# Patient Record
Sex: Male | Born: 1955 | Race: Black or African American | Hispanic: Yes | Marital: Single
Health system: Midwestern US, Community
[De-identification: ages and names within clinical notes are randomized; demographics above are authoritative.]

## PROBLEM LIST (undated history)

## (undated) DIAGNOSIS — R55 Syncope and collapse: Principal | ICD-10-CM

## (undated) DIAGNOSIS — R7989 Other specified abnormal findings of blood chemistry: Principal | ICD-10-CM

## (undated) DIAGNOSIS — I10 Essential (primary) hypertension: Secondary | ICD-10-CM

## (undated) DIAGNOSIS — E0590 Thyrotoxicosis, unspecified without thyrotoxic crisis or storm: Secondary | ICD-10-CM

## (undated) DIAGNOSIS — R739 Hyperglycemia, unspecified: Secondary | ICD-10-CM

## (undated) DIAGNOSIS — R9431 Abnormal electrocardiogram [ECG] [EKG]: Secondary | ICD-10-CM

## (undated) DIAGNOSIS — N179 Acute kidney failure, unspecified: Secondary | ICD-10-CM

## (undated) DIAGNOSIS — Z1211 Encounter for screening for malignant neoplasm of colon: Secondary | ICD-10-CM

---

## 2010-12-20 IMAGING — CT CT CERVICAL SPINE WITHOUT CONTRAST
1 series · 12 of 14 positions shown, 15 images · non-contrast
Comparison: none

REASON FOR EXAM: injury ? fx at c4 on xray, neck jpain
COMMENTS:   LMP: male

[Series 5: axial · axial · 0.34mm/px · z∈[+160,+288]mm · 12 of 76 slices shown, 15 images]
[im 6/76  soft-tissue]
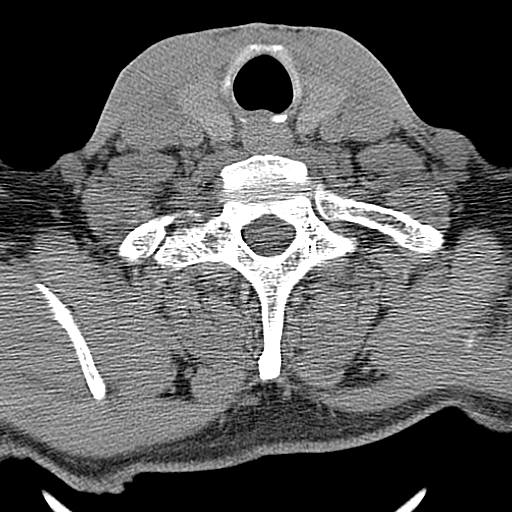
[im 6/76  bone]
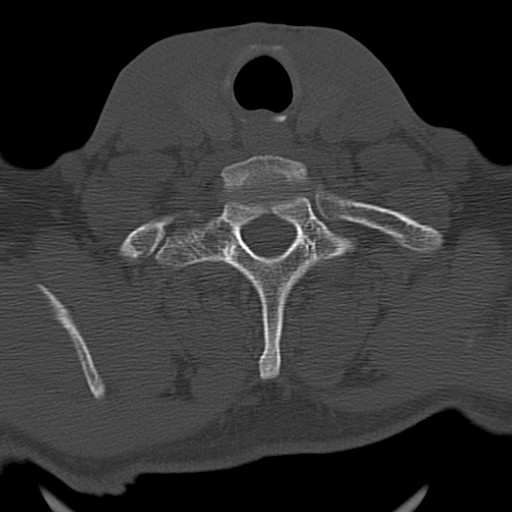
[im 12/76  bone]
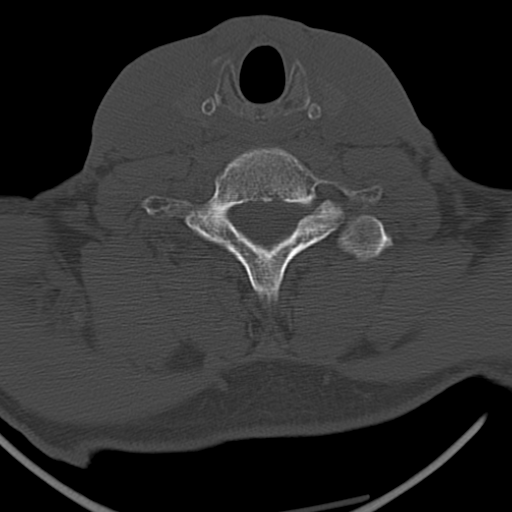
[im 18/76  bone]
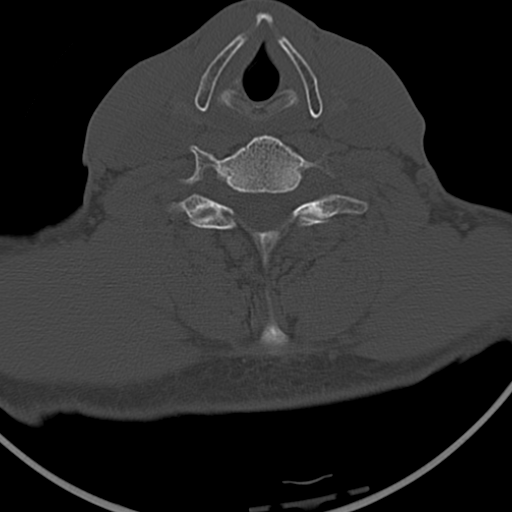
[im 24/76  bone]
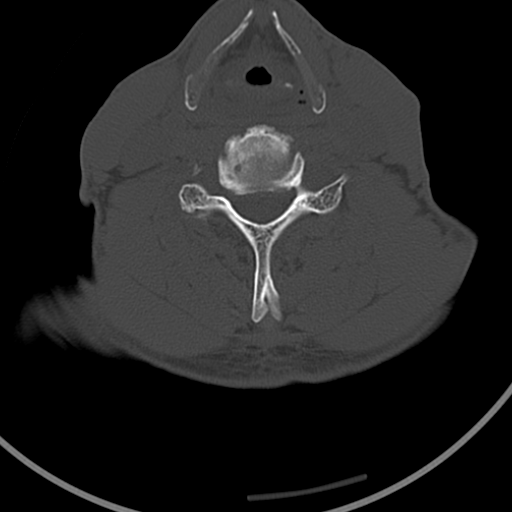
[im 29/76  soft-tissue]
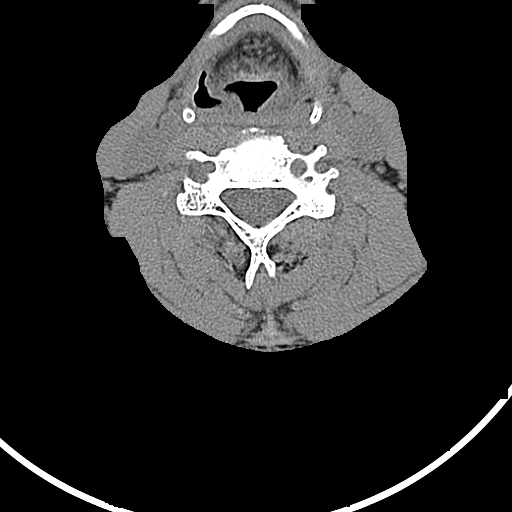
[im 29/76  bone]
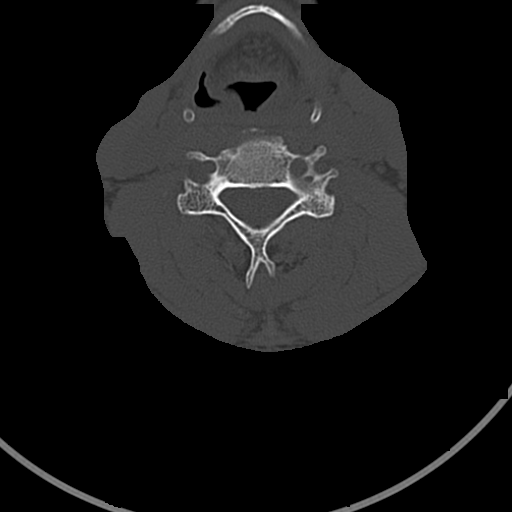
[im 35/76  bone]
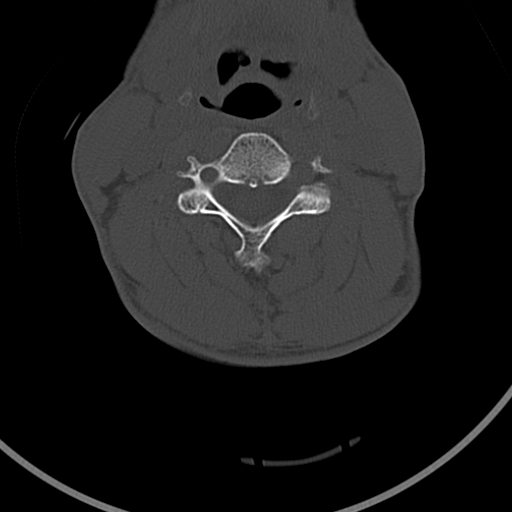
[im 41/76  bone]
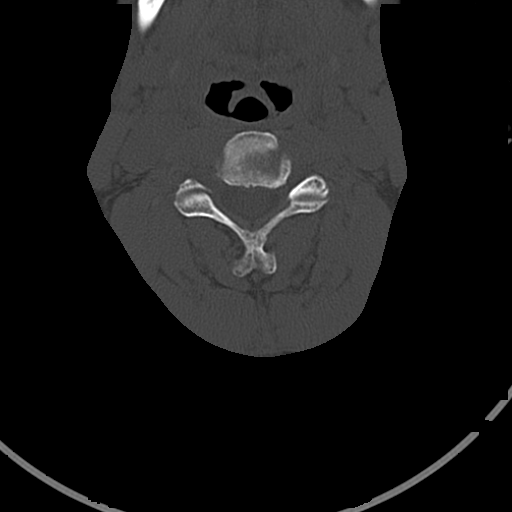
[im 47/76  bone]
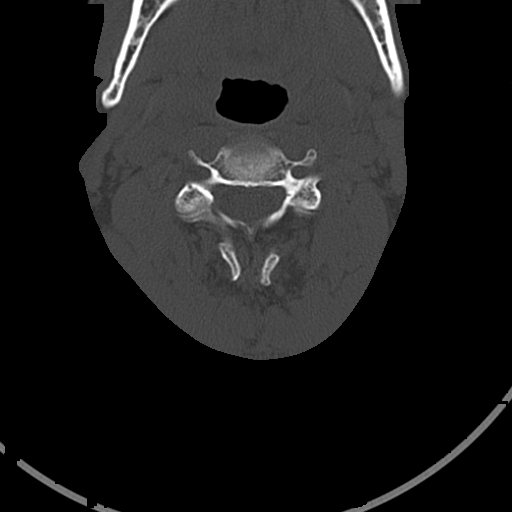
[im 52/76  soft-tissue]
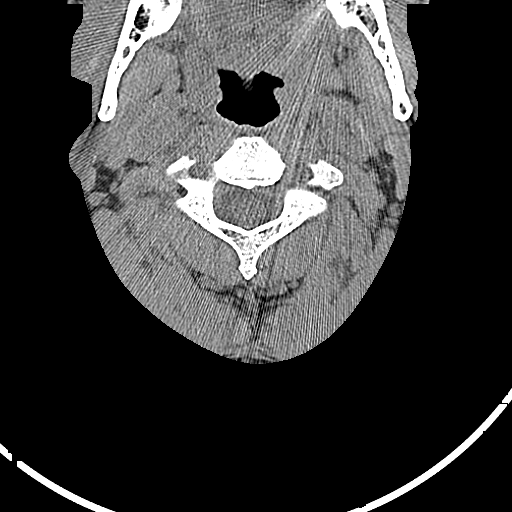
[im 52/76  bone]
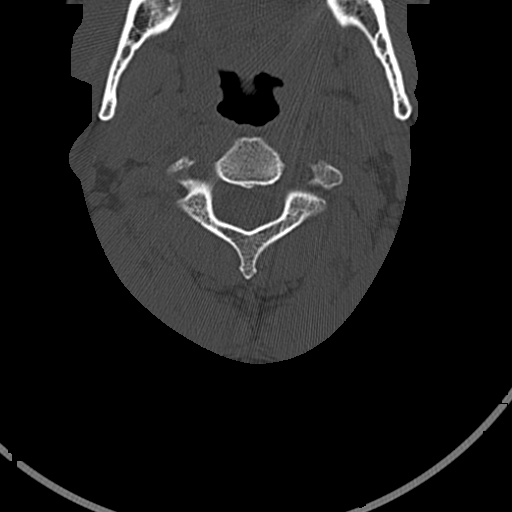
[im 58/76  bone]
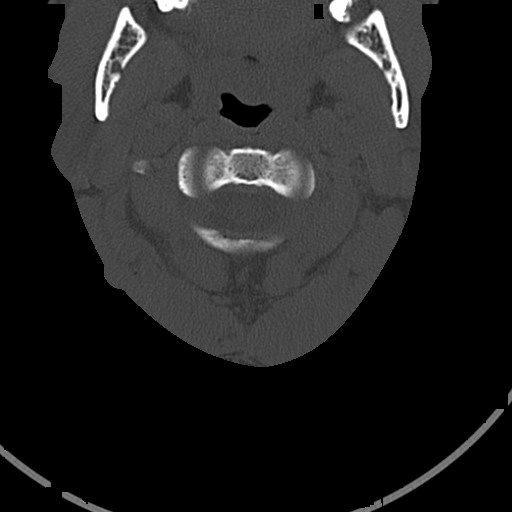
[im 64/76  bone]
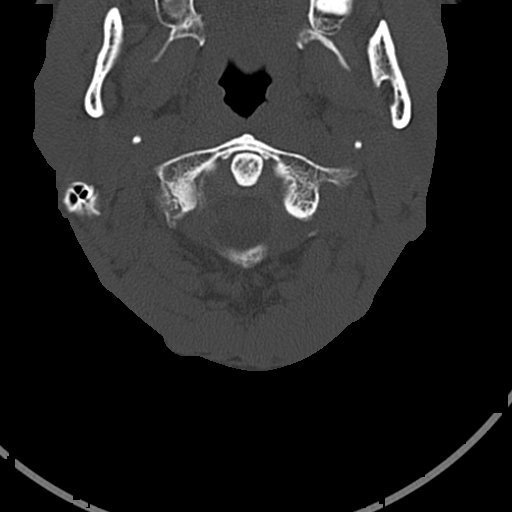
[im 70/76  bone]
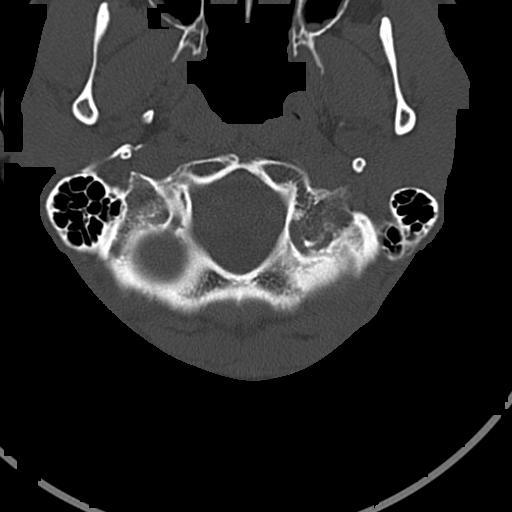

[12 of 14 positions shown; findings below may reference images not displayed]

PROCEDURE:     CT  - CT CERVICAL SPINE WO  - September 05, 2009  [DATE]

RESULT:     Axial, sagittal, and coronal images through the cervical spine
are reviewed.

 The cervical vertebral bodies are preserved in height. The prevertebral
soft tissue spaces are normal. There are anterior bridging osteophytes at
C4-C5 and C5-C6. The odontoid is intact. The bony ring at each cervical
level is intact. There is no evidence of a jumped facet. The lateral masses
of C1 align normally with those of C2. Incidental note is made of a small
amount of mucoperiosteal thickening in the visualized portions of the mid
maxillary sinuses.
IMPRESSION: 1. I do not see evidence of acute fracture nor dislocation of the cervical
spine.
2. There is moderate degenerative change at C5-C6 an mild degenerative
change at C4-C5 manifested by endplate osteophytes as well is annular disc
bulges. I do not see high-grade spinal stenosis.

A preliminary report was sent to the [HOSPITAL] the conclusion
of the study.

## 2012-08-08 IMAGING — CT CT HEAD WITHOUT CONTRAST
2 series · 16 of 30 positions shown, 20 images · non-contrast
Comparison: none

REASON FOR EXAM: HA
COMMENTS:   May transport without cardiac monitor

[Series 2: without · axial · non-contrast · 0.38mm/px · z∈[-56,+78]mm · 13 of 33 slices shown, 17 images]
[im 3/33  brain]
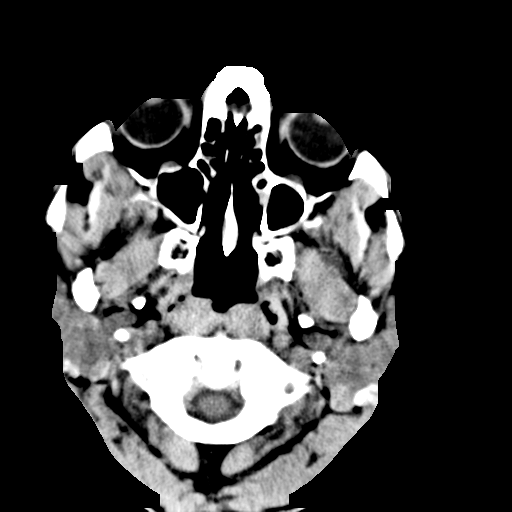
[im 3/33  bone]
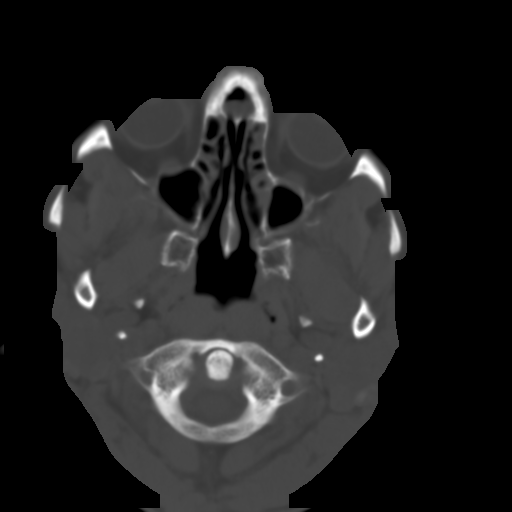
[im 5/33  brain]
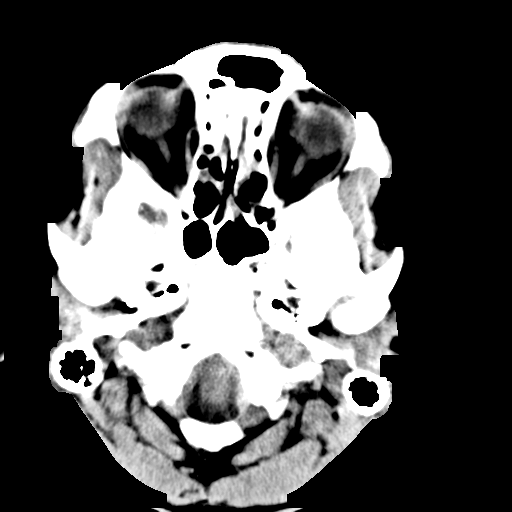
[im 7/33  brain]
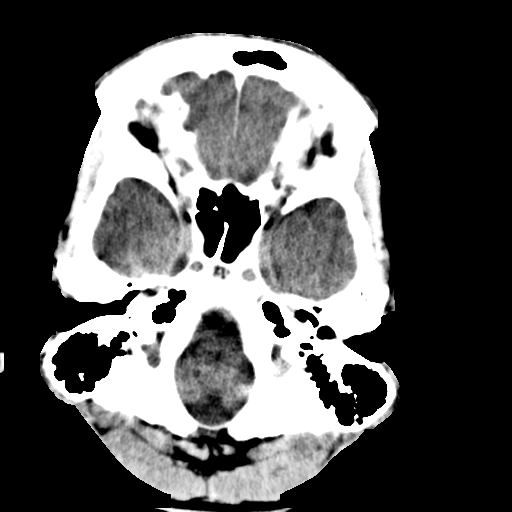
[im 10/33  brain]
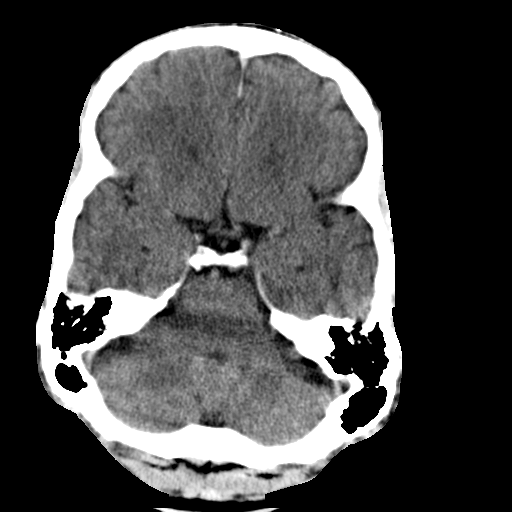
[im 12/33  brain]
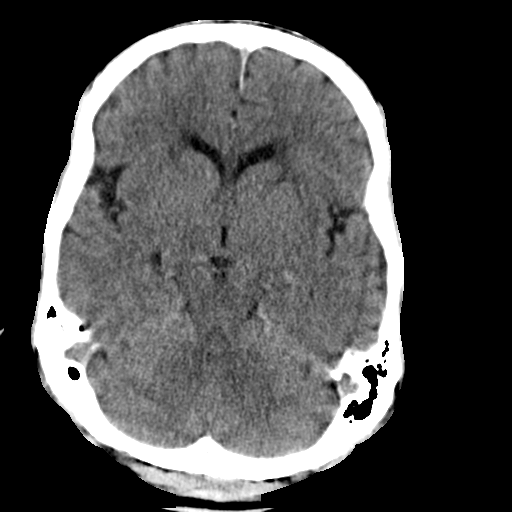
[im 12/33  bone]
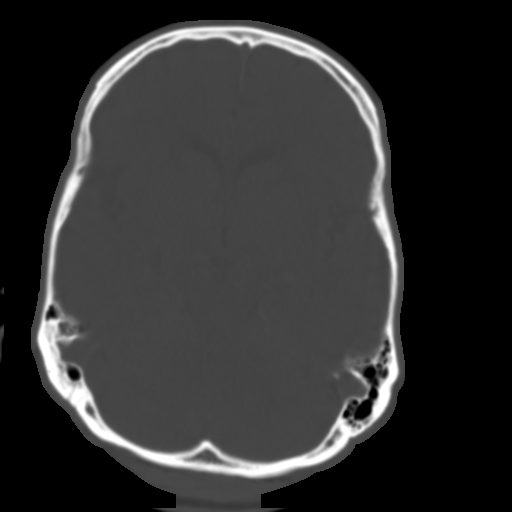
[im 14/33  brain]
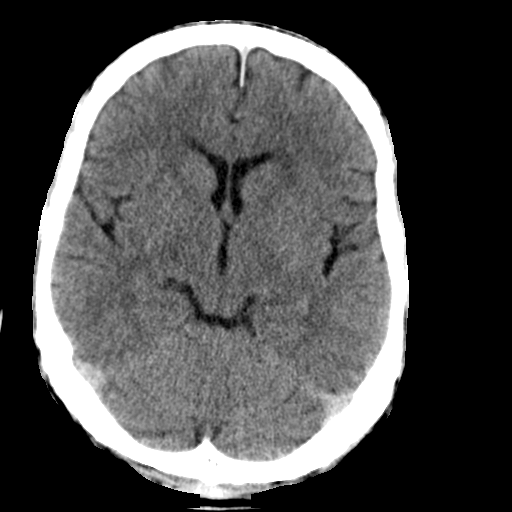
[im 17/33  brain]
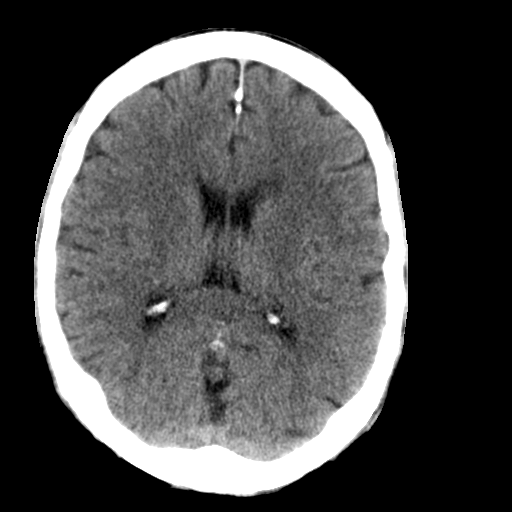
[im 19/33  brain]
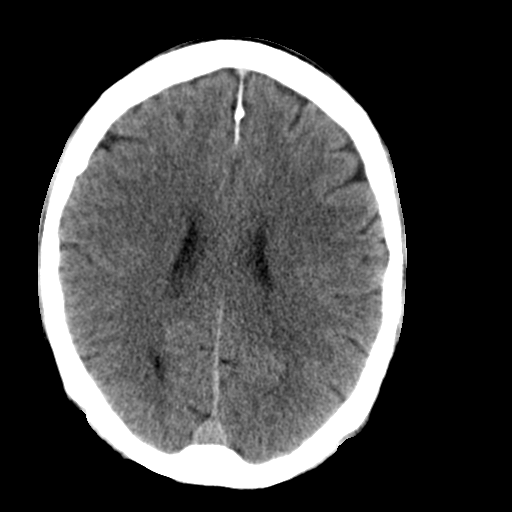
[im 21/33  brain]
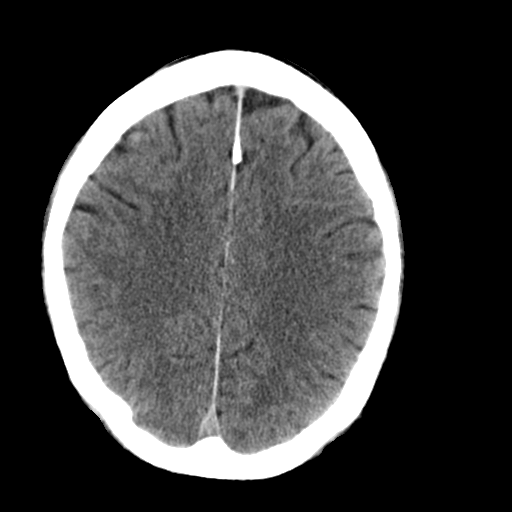
[im 21/33  bone]
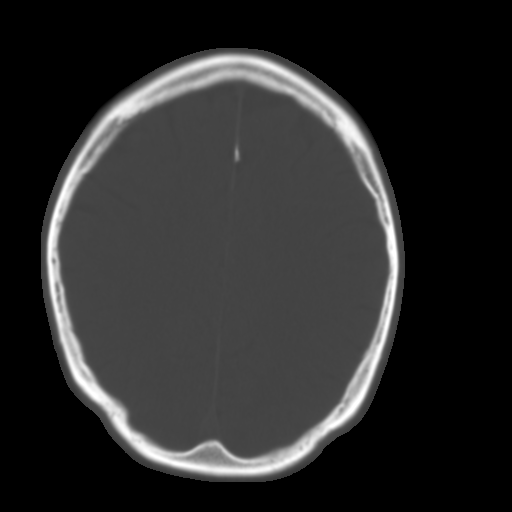
[im 23/33  brain]
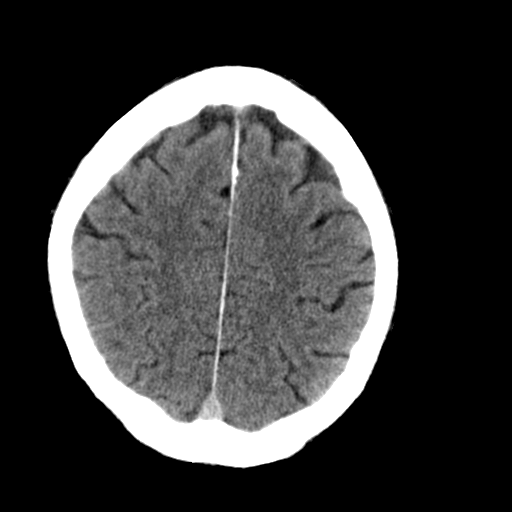
[im 26/33  brain]
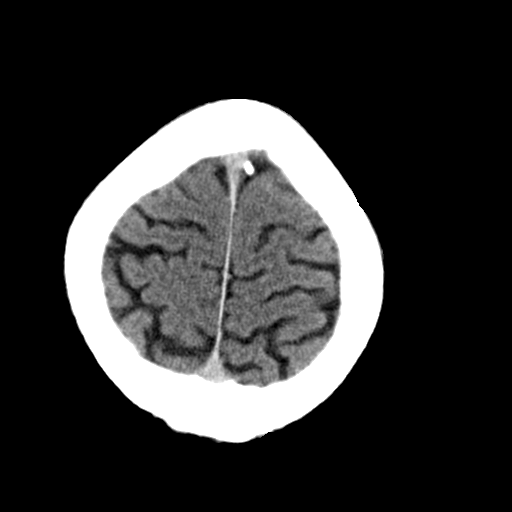
[im 28/33  brain]
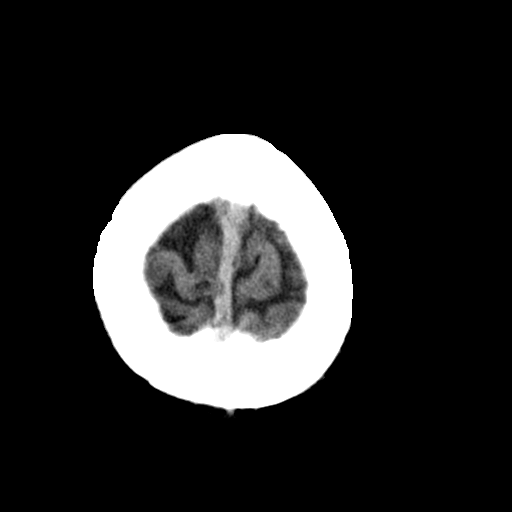
[im 30/33  brain]
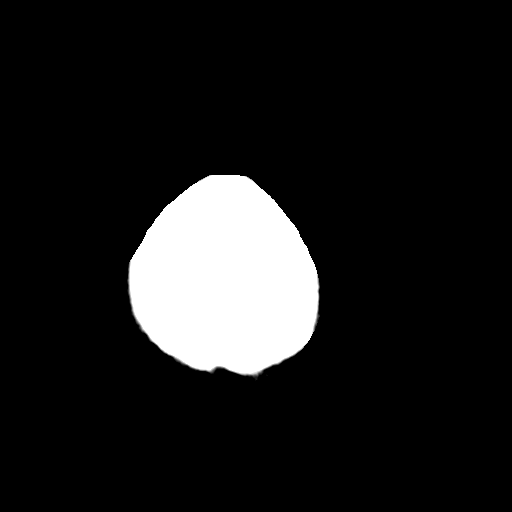
[im 30/33  bone]
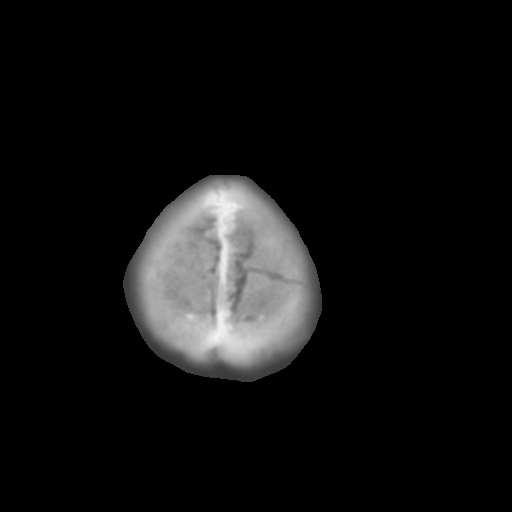

[Series 3: bone · axial · 0.38mm/px · z∈[-56,-12]mm · 3 of 33 slices shown]
[im 3/33  bone]
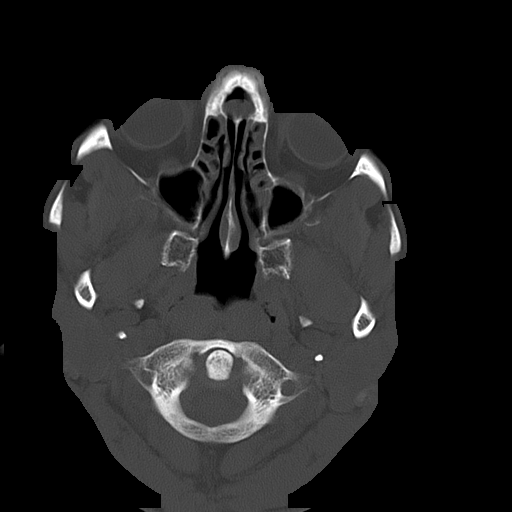
[im 7/33  bone]
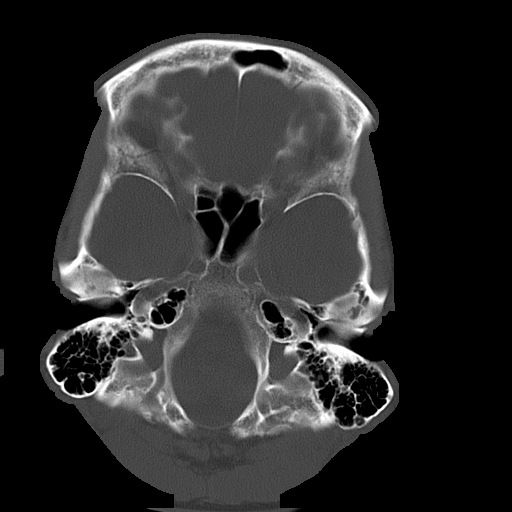
[im 12/33  bone]
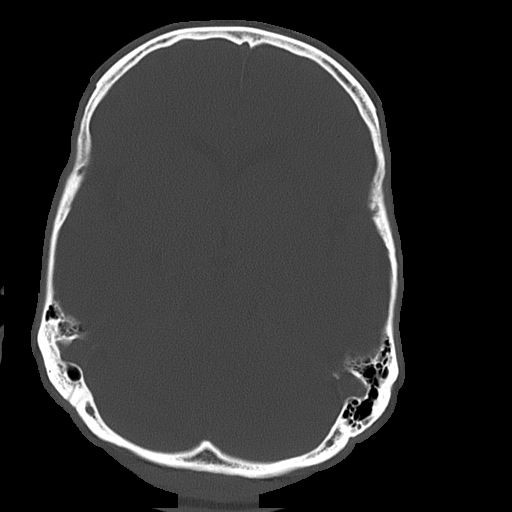

[16 of 30 positions shown; findings below may reference images not displayed]

PROCEDURE:     CT  - CT HEAD WITHOUT CONTRAST  - April 25, 2011  [DATE]

RESULT:     Emergent CT of the brain is performed. The patient has no
previous exam for comparison.

The ventricles and sulci are normal. There is no hemorrhage. There is no
focal mass, mass-effect or midline shift. There is no evidence of edema or
territorial infarct. The bone windows demonstrate normal aeration of the
paranasal sinuses and mastoid air cells except for partial opacification and
mucosal thickening in the ethmoid air cells bilaterally. There is no skull
fracture demonstrated.
IMPRESSION: 1. No acute intracranial abnormality.

## 2016-04-18 ENCOUNTER — Encounter: Attending: Family Medicine | Primary: Family Medicine

## 2016-04-18 NOTE — Telephone Encounter (Signed)
-----   Message from Estanislado Emmsiffany S Brown sent at 04/17/2016  9:24 AM EDT -----  Regarding: Dr.Ngtam/Telephone   Pt returning call in regards to r/s appt from 04/18/16. Best contact number is [phone].

## 2016-04-25 ENCOUNTER — Ambulatory Visit
Admit: 2016-04-25 | Discharge: 2016-04-25 | Payer: PRIVATE HEALTH INSURANCE | Attending: Family Medicine | Primary: Family Medicine

## 2016-04-25 DIAGNOSIS — I10 Essential (primary) hypertension: Secondary | ICD-10-CM

## 2016-04-25 MED ORDER — GABAPENTIN 300 MG CAP
300 mg | ORAL_CAPSULE | Freq: Every evening | ORAL | 1 refills | Status: DC
Start: 2016-04-25 — End: 2017-04-29

## 2016-04-25 MED ORDER — AMLODIPINE 5 MG TAB
5 mg | ORAL_TABLET | Freq: Every day | ORAL | 2 refills | Status: DC
Start: 2016-04-25 — End: 2017-11-06

## 2016-04-25 MED ORDER — TRAZODONE 50 MG TAB
50 mg | ORAL_TABLET | Freq: Every evening | ORAL | 1 refills | Status: DC
Start: 2016-04-25 — End: 2017-04-29

## 2016-04-25 NOTE — Progress Notes (Signed)
Shawn Jackson is a 60 y.o. male, who's a new patient to our practice.     Previous PCP: Dr. Marline BackbonePolkervich at the [facility].   Last seen there: 04/15/2016    HTN: BP on lower end today, denies CP, SOB, lighheadedness, dizziness.  Will decrease Norvasc to 5mg  every day.     Premature ejaculation with both masterbation and intercourse, lasting only 1-2 minutes.  We'll try trazodone.     He have had neck pain and low back pain, these have ben going on for about 8 years.  6 months ago his foot left pain, He walks with a cane for 6 months now.   Report X-ray of his neck shows arthritis.  Report had MRI of his entire back at [facility] 3 months ago.  He doesn't think he saw orthopedic.  Currently no new symptoms.  Denies radiation, LE weakness, loss of bowel or bladder function.  Denies fever, chills.   We discussed work up.  Need for medical record so we'll know what else to do.    We'll start on gabapentin for his pain.        Reviewed: active problem list, medication list, allergies, family history, social history    A comprehensive review of systems was negative except for that written in the HPI.    No Known Allergies  No current outpatient prescriptions on file prior to visit.     No current facility-administered medications on file prior to visit.      Patient Active Problem List   Diagnosis Code   ??? Essential hypertension I10   ??? Hep C w/ coma, chronic (HCC) B18.2   ??? Depression F32.9   ??? Heavy smoker F17.200       Visit Vitals   ??? BP 106/81 (BP 1 Location: Left arm, BP Patient Position: Sitting)   ??? Pulse 80   ??? Temp 97.3 ??F (36.3 ??C) (Oral)   ??? Resp 20   ??? Ht 5\' 10"  (1.778 m)   ??? Wt 161 lb 12.8 oz (73.4 kg)   ??? SpO2 96%   ??? BMI 23.22 kg/m2     General appearance: alert, cooperative, no distress, appears stated age.  Walks with a cane.   Neurologic: Alert and oriented X 3, normal strength and tone, symmetric. Normal without focal findings.  Cranial nerves 2-12 intact.     Mental status: Alert, oriented, thought content appropriate, affect: stable, mood-congruent.    Head: Normocephalic, without obvious abnormality, atraumatic  Eyes: conjunctivae/corneas clear. PERRL, EOM's intact.   Neck: supple, symmetrical, trachea midline, no JVD  Lungs: clear to auscultation bilaterally  Heart: regular rate and rhythm, S1, S2 normal, no murmur, click, rub or gallop  Abdomen: soft, non-tender.   Extremities: extremities normal, atraumatic, no cyanosis or edema      Assessment/Plans:    Shawn Jackson was seen today for new patient, foot pain, back pain and neck pain.    Diagnoses and all orders for this visit:    Essential hypertension  -     amLODIPine (NORVASC) 5 mg tablet; Take 1 Tab by mouth daily.    Chronic low back pain without sciatica, unspecified back pain laterality  -     gabapentin (NEURONTIN) 300 mg capsule; Take 1 Cap by mouth nightly.    Premature ejaculation  -     traZODone (DESYREL) 50 mg tablet; Take 1 Tab by mouth nightly.    Hep C w/ coma, chronic (HCC)    Depression, unspecified depression type  -  traZODone (DESYREL) 50 mg tablet; Take 1 Tab by mouth nightly.    Heavy smoker    Establishing care with new doctor, encounter for      Discussed plans, risk/benefits of treatments/observations.   Through the use of shared decision making, above plans were agreed upon.   Medication compliance advised.  Patient verbalized understanding.     Follow-up Disposition:  Return in about 2 months (around 06/25/2016) for annual exam.      Shawn FieldsNguyen Alleyah Twombly, MD  04/25/2016

## 2016-04-25 NOTE — Progress Notes (Signed)
Pt is a new patient. Here for back and foot pain. .Marland Kitchen

## 2016-04-29 NOTE — Telephone Encounter (Signed)
Spoke with patient. He said that he was changing PCP and didn't want to schedule hospital follow up. Asked him again if wanted to schedule appt and he said no that he was changing PCP.

## 2016-04-29 NOTE — Telephone Encounter (Signed)
-----   Message from Estanislado Emmsiffany S Brown sent at 04/27/2016  9:50 AM EDT -----  Regarding: Dr.Ngtam/Telephone   Pt d/c from [facility] 04/27/16 for fainting spells. Instructed to f/up with pcp asap. Best contact number is [phone].

## 2016-06-25 ENCOUNTER — Encounter: Attending: Family Medicine | Primary: Family Medicine

## 2017-04-28 NOTE — H&P (Signed)
G I Procedure Note           Endoscopy History and Physical               Dr. Thornell M[facility] [place] [facility]  [address] [phone]  [id number]    03-26-1956  61 y.o.  male      Date of Procedure:   Preoperative Diagnosis:       Procedure:   04/29/2017           FAMILY HISTORY COLON CANER  SCREENING                               Procedure(s):  COLONOSCOPY      Gastroenterologist:  Anesthesia:           Blima Rich, MD                               MAC            History and procedure indication:  Shawn Jackson is a 61 y.o. OTHER male who presents with: FAMILY HISTORY COLON CANER  SCREENING    including the additional history of Screening and Family Hstory of Cancer ,Family history of colon cancer, Screening for colon cancer,,        Past Medical History:   Diagnosis Date   ??? Arthritis    ??? Depression    ??? Depression 04/25/2016   ??? Depression 04/25/2016   ??? Essential hypertension 04/25/2016   ??? Essential hypertension 04/25/2016   ??? Heavy smoker 04/25/2016   ??? Heavy smoker 04/25/2016   ??? Hep C w/ coma, chronic ([place]) 04/25/2016   ??? Hep C w/ coma, chronic ([place]) 04/25/2016   ??? Hypertension    ??? Liver disease       Prior to Admission medications    Medication Sig Start Date End Date Taking? Authorizing Provider   irbesartan (AVAPRO) 150 mg tablet Take 150 mg by mouth nightly.    Historical Provider   chlorthalidone (HYGROTEN) 25 mg tablet Take  by mouth daily.    Historical Provider   ledipasvir-sofosbuvir (HARVONI) 90-400 mg tab Take  by mouth.    Historical Provider   gabapentin (NEURONTIN) 300 mg capsule Take 1 Cap by mouth nightly. 04/25/16   Sherrilyn Rist, MD   traZODone (DESYREL) 50 mg tablet Take 1 Tab by mouth nightly. 04/25/16   Sherrilyn Rist, MD   amLODIPine (NORVASC) 5 mg tablet Take 1 Tab by mouth daily. 04/25/16   Sherrilyn Rist, MD     No Known Allergies    Past Surgical History:   Procedure Laterality Date    ??? HX ORTHOPAEDIC       Family History   Problem Relation Age of Onset   ??? Cancer Mother    ??? Heart Disease Mother    ??? Hypertension Mother    ??? Asthma Sister    ??? Migraines Maternal Aunt    ??? Cancer Maternal Uncle    ??? Migraines Maternal Uncle    ??? Hypertension Maternal Uncle    ??? Cancer Maternal Grandmother    ??? Migraines Maternal Grandmother    ??? Hypertension Maternal Grandmother       Social History   Substance Use Topics   ???  Smoking status: Current Some Day Smoker     Packs/day: 1.00     Years: 50.00   ??? Smokeless tobacco: Current User   ??? Alcohol use Yes                                                      PHYSICAL EXAM   There were no vitals taken for this visit.    General appearance:  alert, well appearing, and in no distress  Mental status:  normal mood, behavior, speech, dress, motor activity and thought processes  Nose:      normal and patent, no erythema, discharge or polyps  Mouth:- mucous membranes moist, pharynx normal without lesions                  [x]   No Loose teeth      []     Loose teeth  Finger opening:  [] 1     [] 1.5    []  2     []  2.5     [x]  3      []  3.5     []  4   Mallampati:         []  Class 1     [x]  Class 2    []  Class 3      []  Class 4      Neck - supple,      [x]  Full ROM []  Decreased ROM  []  Short Neck no significant adenopathy    Chest - clear to auscultation, no wheezes, rales or rhonchi, symmetric air entry  Heart: normal rate, regular rhythm, normal S1, S2, no murmurs, rubs, clicks or gallops  Abdomen: abdomen soft, bowel sounds  [x]  normal  []  increased  []  hypoactive                       []  no tenderness  []  epigastric tenderness  []  LLQ tenderness   []  RLQ tenderness                      No masses, organomegaly or guarding.  Rectal exam: negative without mass, lesions or tenderness  Extremities: peripheral pulses normal, no pedal edema, no clubbing or cyanosis  Neurologic: Alert and oriented to person, place, and time; normal strength and tone.                          Normal symmetric reflexes  Normal gait:                                      Assessement:                                 Pre op dx:  FAMILY HISTORY COLON CANER  SCREENING    Additional medical problems list below   Patient Active Problem List   Diagnosis Code   ??? Essential hypertension I10   ??? Hep C w/ coma, chronic (HCC) B18.2   ??? Depression F32.9   ??? Heavy smoker F17.200  This note documentation was performed prior to this planned procedure       after a history and physical was performed in the office. Date: 6 15 18                    Pre Procedure Evaluation (per anesthesia or per h&p)                                                Sedation/Assessment:                                                                                               Mallampati Classification                           [] Class 1                    [] Class 2                    []  Class 3                  []  Class 4                                              ASA classfication         []      Class I: Normally healthy         []      Class II: Patient with mild systemic disease (e.g. hypertension)         []      Class III: Patient with severe systemic disease (e.g. CHF), non-decompensated         []      Class IV: Patient with severe systemic disease, decompensated         []      Class V: Moribund patient, survival unlikely                     Plan:  []   Egd                                [x]  Colonoscopy                               []  with Moderate Sedation /Conscious Sedation                                 [x]  MAC          Patient stable for planned procedure. See orders.     Blima Rich, MD

## 2017-04-29 ENCOUNTER — Inpatient Hospital Stay: Payer: MEDICAID

## 2017-04-29 MED ORDER — SODIUM CHLORIDE 0.9 % IV
INTRAVENOUS | Status: DC
Start: 2017-04-29 — End: 2017-04-29

## 2017-04-29 MED ORDER — LORAZEPAM 2 MG/ML IJ SOLN
2 mg/mL | INTRAMUSCULAR | Status: DC | PRN
Start: 2017-04-29 — End: 2017-04-29

## 2017-04-29 MED ORDER — LIDOCAINE (PF) 10 MG/ML (1 %) IJ SOLN
10 mg/mL (1 %) | INTRAMUSCULAR | Status: DC | PRN
Start: 2017-04-29 — End: 2017-04-29

## 2017-04-29 MED ORDER — ATROPINE 0.1 MG/ML SYRINGE
0.1 mg/mL | Freq: Once | INTRAMUSCULAR | Status: DC | PRN
Start: 2017-04-29 — End: 2017-04-29

## 2017-04-29 MED ORDER — SODIUM CHLORIDE 0.9 % IJ SYRG
Freq: Three times a day (TID) | INTRAMUSCULAR | Status: DC
Start: 2017-04-29 — End: 2017-04-29

## 2017-04-29 MED ORDER — PROPOFOL 10 MG/ML IV EMUL
10 mg/mL | INTRAVENOUS | Status: AC
Start: 2017-04-29 — End: ?

## 2017-04-29 MED ORDER — LIDOCAINE (PF) 20 MG/ML (2 %) IJ SOLN
20 mg/mL (2 %) | INTRAMUSCULAR | Status: AC
Start: 2017-04-29 — End: ?

## 2017-04-29 MED ORDER — DIPHENHYDRAMINE HCL 50 MG/ML IJ SOLN
50 mg/mL | Freq: Once | INTRAMUSCULAR | Status: DC
Start: 2017-04-29 — End: 2017-04-29

## 2017-04-29 MED ORDER — MIDAZOLAM 1 MG/ML IJ SOLN
1 mg/mL | INTRAMUSCULAR | Status: DC | PRN
Start: 2017-04-29 — End: 2017-04-29

## 2017-04-29 MED ORDER — DEXTROSE 5% IN NORMAL SALINE IV
INTRAVENOUS | Status: DC
Start: 2017-04-29 — End: 2017-04-29

## 2017-04-29 MED ORDER — SODIUM CHLORIDE 0.9 % IJ SYRG
INTRAMUSCULAR | Status: DC | PRN
Start: 2017-04-29 — End: 2017-04-29

## 2017-04-29 MED ORDER — NALOXONE 0.4 MG/ML INJECTION
0.4 mg/mL | INTRAMUSCULAR | Status: DC | PRN
Start: 2017-04-29 — End: 2017-04-29

## 2017-04-29 MED ORDER — SODIUM CHLORIDE 0.9 % INJECTION
INTRAMUSCULAR | Status: DC
Start: 2017-04-29 — End: 2017-04-29

## 2017-04-29 MED ORDER — SIMETHICONE 40 MG/0.6 ML ORAL DROPS, SUSP
40 mg/0.6 mL | Freq: Four times a day (QID) | ORAL | Status: DC | PRN
Start: 2017-04-29 — End: 2017-04-29

## 2017-04-29 MED ORDER — FLUMAZENIL 0.1 MG/ML IV SOLN
0.1 mg/mL | INTRAVENOUS | Status: DC | PRN
Start: 2017-04-29 — End: 2017-04-29

## 2017-04-29 MED ORDER — PROPOFOL 10 MG/ML IV EMUL
10 mg/mL | INTRAVENOUS | Status: DC | PRN
Start: 2017-04-29 — End: 2017-04-29
  Administered 2017-04-29 (×26): via INTRAVENOUS

## 2017-04-29 MED ORDER — LIDOCAINE HCL 2 % (20 MG/ML) IJ SOLN
20 mg/mL (2 %) | INTRAMUSCULAR | Status: DC | PRN
Start: 2017-04-29 — End: 2017-04-29
  Administered 2017-04-29: 17:00:00 via INTRAVENOUS

## 2017-04-29 MED ORDER — HYDROMORPHONE (PF) 1 MG/ML IJ SOLN
1 mg/mL | INTRAMUSCULAR | Status: DC | PRN
Start: 2017-04-29 — End: 2017-04-29

## 2017-04-29 MED ORDER — BENZOCAINE 20 % MUCOSAL AEROSOL SPRAY
20 % | Status: DC | PRN
Start: 2017-04-29 — End: 2017-04-29

## 2017-04-29 MED ORDER — LACTATED RINGERS IV
INTRAVENOUS | Status: DC
Start: 2017-04-29 — End: 2017-04-29
  Administered 2017-04-29: 15:00:00 via INTRAVENOUS

## 2017-04-29 MED ORDER — FENTANYL CITRATE (PF) 50 MCG/ML IJ SOLN
50 mcg/mL | INTRAMUSCULAR | Status: DC | PRN
Start: 2017-04-29 — End: 2017-04-29

## 2017-04-29 MED ORDER — MEPERIDINE (PF) 25 MG/ML INJ SOLUTION
25 mg/ml | INTRAMUSCULAR | Status: DC | PRN
Start: 2017-04-29 — End: 2017-04-29

## 2017-04-29 MED ORDER — FENTANYL CITRATE (PF) 50 MCG/ML IJ SOLN
50 mcg/mL | Freq: Once | INTRAMUSCULAR | Status: DC
Start: 2017-04-29 — End: 2017-04-29

## 2017-04-29 MED ORDER — LIDOCAINE 2 % MUCOSAL SOLN
2 % | Status: DC | PRN
Start: 2017-04-29 — End: 2017-04-29

## 2017-04-29 MED ORDER — EPINEPHRINE 0.1 MG/ML SYRINGE
0.1 mg/mL | Freq: Once | INTRAMUSCULAR | Status: DC | PRN
Start: 2017-04-29 — End: 2017-04-29

## 2017-04-29 MED FILL — LACTATED RINGERS IV: INTRAVENOUS | Qty: 1000

## 2017-04-29 MED FILL — LIDOCAINE (PF) 20 MG/ML (2 %) IJ SOLN: 20 mg/mL (2 %) | INTRAMUSCULAR | Qty: 5

## 2017-04-29 MED FILL — SODIUM CHLORIDE 0.9 % INJECTION: INTRAMUSCULAR | Qty: 20

## 2017-04-29 MED FILL — SODIUM CHLORIDE 0.9 % IV: INTRAVENOUS | Qty: 1000

## 2017-04-29 MED FILL — DEXTROSE 5% IN NORMAL SALINE IV: INTRAVENOUS | Qty: 1000

## 2017-04-29 MED FILL — DIPRIVAN 10 MG/ML INTRAVENOUS EMULSION: 10 mg/mL | INTRAVENOUS | Qty: 20

## 2017-04-29 MED FILL — SODIUM CHLORIDE 0.9 % INJECTION: INTRAMUSCULAR | Qty: 10

## 2017-04-29 NOTE — Other (Signed)
Pt arrived with jug of prep approx 3/4 finished; states that he has been drinking prep this morning; began at midnight last night; last solids were yesterday around noon or 1; pt also has form for lab work that he states he did not have done because he was not aware that he was supposed to; Dr. Herbert DeanerLinas and Dr. Jean RosenthalJackson aware of pt's prep and NPO status

## 2017-04-29 NOTE — Anesthesia Pre-Procedure Evaluation (Signed)
Anesthetic History   No history of anesthetic complications            Review of Systems / Medical History  Patient summary reviewed and pertinent labs reviewed    Pulmonary          Smoker         Neuro/Psych   Within defined limits           Cardiovascular    Hypertension              Exercise tolerance: >4 METS     GI/Hepatic/Renal  Within defined limits              Endo/Other        Arthritis     Other Findings              Physical Exam    Airway  Mallampati: II  TM Distance: 4 - 6 cm  Neck ROM: normal range of motion   Mouth opening: Normal     Cardiovascular    Rhythm: regular  Rate: normal         Dental    Dentition: Poor dentition     Pulmonary  Breath sounds clear to auscultation               Abdominal  GI exam deferred       Other Findings            Anesthetic Plan    ASA: 2  Anesthesia type: MAC            Anesthetic plan and risks discussed with: Patient

## 2017-04-29 NOTE — Other (Signed)
Pt rescheduled for last patient due to PO intake this morning

## 2017-04-29 NOTE — Other (Signed)
Endoscope was pre-cleaned at bedside immediately following procedure by Sheikh Haque.

## 2017-04-29 NOTE — Procedures (Signed)
G I Procedure Note            COLONOSCOPY   Dr. Chelsae Zanella Shawn Jackson   [place] office [phone]  [place] [facility] [facility]    [phone]        Shawn RutherfordVan Jackson                                   [id number]                                  [id number]   December 26, 1955                                      61 y.o.                                    male      Procedure Date: 04/29/2017                                                                                                              Pre Op Diagnosis:                     1. FAMILY HISTORY COLON CANCER  SCREENING                                                                                                                                                                           Post Op Diagnosis:                    1.   COLON POLYPS    Removed                                                        2.  Poor  prep      3.               H&p completed: Yes            Anesthesia Assessment: Performed prior to procedure:      No change  Anesthesia Plan: Performed prior to procedure:                   No change       Medications: See Reviewed List and Reconcilation           Informed consent was obtained     Risk Statement:  Prior to the procedure the risks were explained to the patient and/or to the family including but not limited to perforation, bleeding, adverse drug reaction, aspiration, and even the need for possible surgery.  A colonoscopy exam is not 100% accurate which may be related to preparation or blind spots during the exam.The possibility that an abnormality and /or cancer could be missed was also discussed as well as alternative x-ray options.         Instrument:    Olympus adult Videocolonoscope                                   Immediate Procedure Reassessment Completed     With the patient in the left lateral position, a rectal examination was performed and the findings were: negative without mass, lesions or  tenderness   The Olympus Video colonoscope was inserted under direct vision into the rectum. The colonoscope was passed from the rectum to the cecum, which was identified by the ileocecal valve. The colon findings demonstrated:  ANUS: Anal exam reveals no masses or external hemorrhoids, sphincter tone is normal.   RECTUM: Rectal exam reveals no masses  .   SIGMOID COLON: The mucosa is normal with good vascular pattern and without ulcers, diverticula, and polyps.   DESCENDING COLON: The mucosa is normal with good vascular pattern and without ulcers, diverticula, and polyps.   SPLENIC FLEXURE: The splenic flexure is normal.   TRANSVERSE COLON: The mucosa is normal with good vascular pattern and without ulcers, diverticula, and polyps.   HEPATIC FLEXURE: The hepatic flexure is normal.   ASCENDING COLON: The mucosa is normal with good vascular pattern and without ulcers, diverticula, and polyps.   CECUM:  The ileocecal valve appears normal.   TERMINAL ILEUM: The terminal ileum was not entered.     A         A polyp was identified.  20 mm in size, located in the cecum with submucosal saline injection  10 mm in size, located in the descending colon removed by snare cautery and retrieved for pathology The polypectomy site was reviewed and was free of hemorrhage.     The colonoscope was slowly withdrawn >6 minute period and the instrument was retroflexed in the rectum. The rectal findings were:Normal  The patient tolerated the entire procedure well.      Blood Loss minimal nominal    For biopsy  Specimen verification by physician and nurse two sources, name,           social security numbers     Colon preparation was good    Recommendations:     - Await pathology.     - 2nd prep repeat in 1year       Copies sent to   Levonne Spiller, MD  CC:  Phys Other, MD

## 2017-04-29 NOTE — Anesthesia Post-Procedure Evaluation (Signed)
Post-Anesthesia Evaluation and Assessment    Patient: Shawn Jackson MRN: [id number]  SSN: [id number]    Date of Birth: 13-Jun-1956  Age: 61 y.o.  Sex: male       Cardiovascular Function/Vital Signs  Visit Vitals   ??? BP 119/86 (BP 1 Location: Left arm, BP Patient Position: At rest)   ??? Pulse (!) 55   ??? Temp 36.4 ??C (97.6 ??F)   ??? Resp 14   ??? Ht 5' 10"  (1.778 m)   ??? Wt 72.6 kg (160 lb)   ??? SpO2 99%   ??? BMI 22.96 kg/m2       Patient is status post MAC anesthesia for Procedure(s):  COLONOSCOPY  POLYPECTOMY  INJECTION.    Nausea/Vomiting: None    Postoperative hydration reviewed and adequate.    Pain:  Pain Scale 1: Numeric (0 - 10) (04/29/17 1350)  Pain Intensity 1: 0 (04/29/17 1350)   Managed    Neurological Status:   Neuro (WDL): Exceptions to WDL (04/29/17 1336)  Neuro  Neurologic State: Drowsy;Eyes open spontaneously (04/29/17 1336)  Orientation Level: Oriented X4 (04/29/17 1336)  Cognition: Follows commands (04/29/17 1336)  Speech: Clear (04/29/17 1336)  LUE Motor Response: Purposeful (04/29/17 1336)  LLE Motor Response: Purposeful (04/29/17 1336)  RUE Motor Response: Purposeful (04/29/17 1336)  RLE Motor Response: Purposeful (04/29/17 1336)   At baseline    Mental Status and Level of Consciousness: Arousable    Pulmonary Status:   O2 Device: Room air (04/29/17 1355)   Adequate oxygenation and airway patent    Complications related to anesthesia: None    Post-anesthesia assessment completed. No concerns    Signed By: Armida Sans, MD     April 29, 2017

## 2017-04-29 NOTE — Other (Signed)
Handoff Report from Operating Room to PACU    Report received from W. Vickey SagesAtkins, RN and Dr. Herbert DeanerLinas regarding [place].      Surgeon(s):  Levonne SpillerHorace J Jackson, MD  And Procedure(s) (LRB):  COLONOSCOPY (N/A)  POLYPECTOMY (N/A)  INJECTION (N/A)  confirmed   with allergies discussed.    Anesthesia type, drugs, patient history, complications, estimated blood loss, vital signs, intake and output, and lines and temperature were reviewed.

## 2017-04-29 NOTE — Procedures (Signed)
Procedures  by Levonne Spiller, MD at 04/29/17 1326                Author: Levonne Spiller, MD  Service: Gastroenterology  Author Type: Physician       Filed: 04/29/17 1328  Date of Service: 04/29/17 1326  Status: Signed          Editor: Levonne Spiller, MD (Physician)            Pre-procedure Diagnoses        1. Special screening for malignant neoplasms, colon [Z12.11]                           Post-procedure Diagnoses        1. Polyp of ascending colon, unspecified type [D12.2]                           Procedures        1. COLONOSCOPY [[id number] (Custom)]                                                                                                            G I Procedure Note             COLONOSCOPY        Dr. Despina Arias office [phone]   [facility]    [phone]         Shawn Jackson                                   [phone]                                  [id number]    1956/09/01                                      61 y.o.                                     male            Procedure Date: 04/29/2017  Pre Op Diagnosis:                      1. FAMILY HISTORY COLON CANCER   SCREENING                                                                                                                                                                                       Post Op Diagnosis:                     1.   COLON POLYPS    Removed                                                             2.  Poor prep           3.                           H&p completed:  Yes                        Anesthesia Assessment: Performed prior to procedure:      No change   Anesthesia Plan: Performed prior to procedure:                   No change             Medications: See Reviewed List and Reconcilation                    Informed consent was  obtained         Risk Statement:  Prior  to the procedure the risks were explained to the patient and/or  to the family including but not limited to perforation, bleeding, adverse drug reaction, aspiration, and even the need for possible surgery.  A colonoscopy exam is not 100% accurate which may be related to preparation or blind spots during the exam.The  possibility that an abnormality and /or cancer could be missed was also discussed as well as alternative x-ray options.             Instrument:    Olympus adult Videocolonoscope  Immediate Procedure Reassessment Completed           With the patient in the left lateral position, a rectal examination was performed and  the findings were:  negative without mass, lesions or tenderness   The Olympus Video colonoscope  was inserted under direct vision into the rectum. The colonoscope was passed from the rectum to the  cecum, which was identified by the ileocecal valve. The colon findings demonstrated:   ANUS: Anal exam reveals no masses or external hemorrhoids, sphincter tone is normal.    RECTUM: Rectal exam reveals no masses  .    SIGMOID COLON: The mucosa is normal with good vascular pattern and without ulcers, diverticula, and polyps.    DESCENDING COLON: The mucosa is normal with good vascular pattern and without ulcers, diverticula, and polyps.    SPLENIC FLEXURE: The splenic flexure is normal.    TRANSVERSE COLON: The mucosa is normal with good vascular pattern and without ulcers, diverticula, and polyps.    HEPATIC FLEXURE: The hepatic flexure is normal.    ASCENDING COLON: The mucosa is normal with good vascular pattern and without ulcers, diverticula, and polyps.    CECUM:  The ileocecal valve appears normal.     TERMINAL ILEUM: The terminal ileum was not entered.       A            A polyp was identified.  20 mm in size, located in the cecum with submucosal saline injection   10 mm in size, located in the descending colon removed by snare cautery and  retrieved for pathology The polypectomy site was reviewed and was free of hemorrhage.       The colonoscope was slowly withdrawn >6 minute period and the instrument  was retroflexed in the rectum. The rectal findings were:Normal   The patient tolerated the entire procedure well.        Blood Loss minimal nominal      For biopsy  Specimen verification by physician and nurse two sources, name,           social security numbers       Colon preparation was good      Recommendations:      - Await pathology.      - 2nd prep repeat in 1year           Copies sent to    Levonne SpillerHorace J Langston Summer[place], MD   CC:  Phys Other, MD

## 2017-05-01 MED FILL — DIPRIVAN 10 MG/ML INTRAVENOUS EMULSION: 10 mg/mL | INTRAVENOUS | Qty: 380

## 2017-05-01 MED FILL — LIDOCAINE HCL 2 % (20 MG/ML) IJ SOLN: 20 mg/mL (2 %) | INTRAMUSCULAR | Qty: 60

## 2017-11-06 ENCOUNTER — Ambulatory Visit: Admit: 2017-11-06 | Payer: PRIVATE HEALTH INSURANCE | Attending: Family Medicine | Primary: Family Medicine

## 2017-11-06 DIAGNOSIS — H9313 Tinnitus, bilateral: Secondary | ICD-10-CM

## 2017-11-06 NOTE — Progress Notes (Signed)
Chief Complaint   Patient presents with   ??? Ringing in Ear     he is a 62 y.o. year old male who presents for evaluation of ringing in the ear for the past 5 years. Patient has had testing but nothing has been found through [facility].  He is seen specialist over at [facility] but is interested in going to see a second opinion.  He is also interested in seeing Jonny Ruiz Hopkin's menses are that they have very good doctors in [place].  Denies any loud sound exposure and states that her ringing is constant.  Of note in office today he is wearing ear buds for his cell phone.    Reviewed and agree with Nurse Note and duplicated in this note.  Reviewed PmHx, RxHx, FmHx, SocHx, AllgHx and updated and dated in the chart.    Family History   Problem Relation Age of Onset   ??? Cancer Mother    ??? Heart Disease Mother    ??? Hypertension Mother    ??? Asthma Sister    ??? Migraines Maternal Aunt    ??? Cancer Maternal Uncle    ??? Migraines Maternal Uncle    ??? Hypertension Maternal Uncle    ??? Cancer Maternal Grandmother    ??? Migraines Maternal Grandmother    ??? Hypertension Maternal Grandmother        Past Medical History:   Diagnosis Date   ??? Arthritis    ??? Depression    ??? Depression 04/25/2016   ??? Depression 04/25/2016   ??? Essential hypertension 04/25/2016   ??? Essential hypertension 04/25/2016   ??? Heavy smoker 04/25/2016   ??? Heavy smoker 04/25/2016   ??? Hep C w/ coma, chronic (HCC) 04/25/2016   ??? Hep C w/ coma, chronic (HCC) 04/25/2016   ??? Hypertension    ??? Liver disease       Social History     Socioeconomic History   ??? Marital status: SINGLE     Spouse name: Not on file   ??? Number of children: Not on file   ??? Years of education: Not on file   ??? Highest education level: Not on file   Tobacco Use   ??? Smoking status: Current Some Day Smoker     Packs/day: 1.00     Years: 50.00     Pack years: 50.00   ??? Smokeless tobacco: Current User   Substance and Sexual Activity   ??? Alcohol use: No   ??? Drug use: Yes     Types: Marijuana   ??? Sexual activity: Yes      Partners: Female     Birth control/protection: Condom        Review of Systems - negative except as listed above      Objective:     Vitals:    11/06/17 1338   BP: 131/86   Pulse: 67   Resp: 16   Temp: 97.9 ??F (36.6 ??C)   TempSrc: Oral   SpO2: 97%   Weight: 164 lb 4.8 oz (74.5 kg)   Height: 5\' 10"  (1.778 m)       Physical Examination: General appearance - alert, well appearing, and in no distress  Eyes - pupils equal and reactive, extraocular eye movements intact  Ears - bilateral TM's and external ear canals normal  Nose - normal and patent, no erythema, discharge or polyps  Mouth - mucous membranes moist, pharynx normal without lesions  Neck - supple, no significant adenopathy  Chest -  clear to auscultation, no wheezes, rales or rhonchi, symmetric air entry  Heart - normal rate, regular rhythm, normal S1, S2, no murmurs, rubs, clicks or gallops  Abdomen - soft, nontender, nondistended, no masses or organomegaly  Musculoskeletal - no joint tenderness, deformity or swelling  Extremities - peripheral pulses normal, no pedal edema, no clubbing or cyanosis  Skin - normal coloration and turgor, no rashes, no suspicious skin lesions noted    Assessment/ Plan:   Diagnoses and all orders for this visit:    1. Tinnitus of both ears  -     REFERRAL TO ENT-OTOLARYNGOLOGY    2. Essential hypertension  -     METABOLIC PANEL, COMPREHENSIVE       Follow-up Disposition:  Return if symptoms worsen or fail to improve.          I have discussed the diagnosis with the patient and the intended plan as seen in the above orders.  The patient has received an after-visit summary and questions were answered concerning future plans.     Medication Side Effects and Warnings were discussed with patient,  Patient Labs were reviewed and or requested, and  Patient Past Records were reviewed and or requested  yes       Pt agrees to call or return to clinic and/or go to closest ER with any  worsening of symptoms.  This may include, but not limited to increased fever (>100.4) with NSAIDS or Tylenol, increased edema, confusion, rash, worsening of presenting symptoms.

## 2017-11-07 LAB — METABOLIC PANEL, COMPREHENSIVE
A-G Ratio: 1.4 (ref 1.2–2.2)
ALT (SGPT): 16 IU/L (ref 0–44)
AST (SGOT): 21 IU/L (ref 0–40)
Albumin: 4.2 g/dL (ref 3.6–4.8)
Alk. phosphatase: 96 IU/L (ref 39–117)
BUN/Creatinine ratio: 11 (ref 10–24)
BUN: 14 mg/dL (ref 8–27)
Bilirubin, total: 0.4 mg/dL (ref 0.0–1.2)
CO2: 22 mmol/L (ref 20–29)
Calcium: 9.3 mg/dL (ref 8.6–10.2)
Chloride: 103 mmol/L (ref 96–106)
Creatinine: 1.28 mg/dL — ABNORMAL HIGH (ref 0.76–1.27)
GFR est AA: 68 mL/min/{1.73_m2} (ref >59)
GFR est non-AA: 59 mL/min/{1.73_m2} — ABNORMAL LOW (ref >59)
GLOBULIN, TOTAL: 3.1 g/dL (ref 1.5–4.5)
Glucose: 90 mg/dL (ref 65–99)
Potassium: 4.6 mmol/L (ref 3.5–5.2)
Protein, total: 7.3 g/dL (ref 6.0–8.5)
Sodium: 142 mmol/L (ref 134–144)

## 2017-12-04 ENCOUNTER — Encounter: Attending: Family Medicine | Primary: Family Medicine

## 2017-12-11 ENCOUNTER — Ambulatory Visit: Admit: 2017-12-11 | Payer: PRIVATE HEALTH INSURANCE | Attending: Family Medicine | Primary: Family Medicine

## 2017-12-11 DIAGNOSIS — R7989 Other specified abnormal findings of blood chemistry: Secondary | ICD-10-CM

## 2017-12-11 MED ORDER — BUPROPION SR 150 MG TAB
150 mg | ORAL_TABLET | Freq: Every day | ORAL | 1 refills | Status: DC
Start: 2017-12-11 — End: 2018-02-23

## 2017-12-11 NOTE — Progress Notes (Signed)
Chief Complaint   Patient presents with   ??? Abnormal Lab Results     he is a 62 y.o. year old male who presents for follow-up of having elevated kidney function and here to recheck.  Patient states that he does not have a history of any kidney problems.  He is on blood pressure medication states that he has taken it today.  Denies any frequent urination or blood with urination.  He would also like help with smoking cessation.  States that he is cut back his smoking but still has a craving and would like medication to help with this.    Reviewed and agree with Nurse Note and duplicated in this note.  Reviewed PmHx, RxHx, FmHx, SocHx, AllgHx and updated and dated in the chart.    Family History   Problem Relation Age of Onset   ??? Cancer Mother    ??? Heart Disease Mother    ??? Hypertension Mother    ??? Asthma Sister    ??? Migraines Maternal Aunt    ??? Cancer Maternal Uncle    ??? Migraines Maternal Uncle    ??? Hypertension Maternal Uncle    ??? Cancer Maternal Grandmother    ??? Migraines Maternal Grandmother    ??? Hypertension Maternal Grandmother        Past Medical History:   Diagnosis Date   ??? Arthritis    ??? Depression    ??? Depression 04/25/2016   ??? Depression 04/25/2016   ??? Essential hypertension 04/25/2016   ??? Essential hypertension 04/25/2016   ??? Heavy smoker 04/25/2016   ??? Heavy smoker 04/25/2016   ??? Hep C w/ coma, chronic (HCC) 04/25/2016   ??? Hep C w/ coma, chronic (HCC) 04/25/2016   ??? Hypertension    ??? Liver disease       Social History     Socioeconomic History   ??? Marital status: SINGLE     Spouse name: Not on file   ??? Number of children: Not on file   ??? Years of education: Not on file   ??? Highest education level: Not on file   Tobacco Use   ??? Smoking status: Current Some Day Smoker     Packs/day: 1.00     Years: 50.00     Pack years: 50.00   ??? Smokeless tobacco: Current User   Substance and Sexual Activity   ??? Alcohol use: No   ??? Drug use: Yes     Types: Marijuana   ??? Sexual activity: Yes     Partners: Female      Birth control/protection: Condom        Review of Systems - negative except as listed above      Objective:     Vitals:    12/11/17 0917   BP: 145/90   Pulse: 67   Resp: 16   Temp: 98.2 ??F (36.8 ??C)   TempSrc: Oral   SpO2: 96%   Weight: 160 lb 1.6 oz (72.6 kg)   Height: 5\' 10"  (1.778 m)       Physical Examination: General appearance - alert, well appearing, and in no distress  Eyes - pupils equal and reactive, extraocular eye movements intact  Ears - bilateral TM's and external ear canals normal  Nose - normal and patent, no erythema, discharge or polyps  Mouth - mucous membranes moist, pharynx normal without lesions  Neck - supple, no significant adenopathy  Chest - clear to auscultation, no wheezes, rales or rhonchi, symmetric  air entry  Heart - normal rate, regular rhythm, normal S1, S2, no murmurs, rubs, clicks or gallops  Abdomen - soft, nontender, nondistended, no masses or organomegaly  Extremities - peripheral pulses normal, no pedal edema, no clubbing or cyanosis  Skin - normal coloration and turgor, no rashes, no suspicious skin lesions noted    Assessment/ Plan:   Diagnoses and all orders for this visit:    1. Elevated serum creatinine  -     METABOLIC PANEL, BASIC    2. Encounter for smoking cessation counseling    3. Essential hypertension    Other orders  -     buPROPion SR (WELLBUTRIN SR) 150 mg SR tablet; Take 1 Tab by mouth daily.       Follow-up Disposition:  Return in about 4 weeks (around 01/08/2018) for Blood Pressure Check.        I have discussed the diagnosis with the patient and the intended plan as seen in the above orders.  The patient has received an after-visit summary and questions were answered concerning future plans.     Medication Side Effects and Warnings were discussed with patient,  Patient Labs were reviewed and or requested, and  Patient Past Records were reviewed and or requested  yes       Pt agrees to call or return to clinic and/or go to closest ER with any  worsening of symptoms.  This may include, but not limited to increased fever (>100.4) with NSAIDS or Tylenol, increased edema, confusion, rash, worsening of presenting symptoms.

## 2017-12-12 LAB — METABOLIC PANEL, BASIC
BUN/Creatinine ratio: 12 (ref 10–24)
BUN: 14 mg/dL (ref 8–27)
CO2: 22 mmol/L (ref 20–29)
Calcium: 9.2 mg/dL (ref 8.6–10.2)
Chloride: 105 mmol/L (ref 96–106)
Creatinine: 1.14 mg/dL (ref 0.76–1.27)
GFR est AA: 78 mL/min/{1.73_m2} (ref >59)
GFR est non-AA: 68 mL/min/{1.73_m2} (ref >59)
Glucose: 101 mg/dL — ABNORMAL HIGH (ref 65–99)
Potassium: 4.4 mmol/L (ref 3.5–5.2)
Sodium: 143 mmol/L (ref 134–144)

## 2018-01-08 ENCOUNTER — Ambulatory Visit
Admit: 2018-01-08 | Discharge: 2018-01-08 | Payer: PRIVATE HEALTH INSURANCE | Attending: Family Medicine | Primary: Family Medicine

## 2018-01-08 DIAGNOSIS — I10 Essential (primary) hypertension: Secondary | ICD-10-CM

## 2018-01-08 MED ORDER — IRBESARTAN 150 MG TAB
150 mg | ORAL_TABLET | Freq: Every evening | ORAL | 3 refills | Status: DC
Start: 2018-01-08 — End: 2018-02-02

## 2018-01-08 NOTE — Patient Instructions (Signed)
DASH diet: Healthy eating to lower your blood pressure  The DASH diet emphasizes portion size, eating a variety of foods and getting the right amount of nutrients. Discover how DASH can improve your health and lower your blood pressure.     DASH stands for Dietary Approaches to Stop Hypertension. The DASH diet is a lifelong approach to healthy eating that's designed to help treat or prevent high blood pressure (hypertension). The DASH diet encourages you to reduce the sodium in your diet and eat a variety of foods rich in nutrients that help lower blood pressure, such as potassium, calcium and magnesium.   By following the DASH diet, you may be able to reduce your blood pressure by a few points in just two weeks. Over time, your blood pressure could drop by eight to 14 points, which can make a significant difference in your health risks.   Because the DASH diet is a healthy way of eating, it offers health benefits besides just lowering blood pressure. The DASH diet may offer protection against osteoporosis, cancer, heart disease, stroke and diabetes. And while the DASH diet is not a weight-loss program, you may indeed lose unwanted pounds because it can help guide you toward healthier meals and snacks.   DASH diet: Sodium levels  A key goal of the DASH diet is reducing how much sodium you eat, since sodium can dramatically increase blood pressure in people who are sensitive to its effects. In addition to the standard DASH diet, there is also a lower sodium version of the diet. You can choose the version of the diet that meets your health needs:   Standard DASH diet. You can consume up to 2,300 milligrams (mg) of sodium a day.   Lower sodium DASH diet. You can consume up to 1,500 mg of sodium a day.  Both versions of the DASH diet aim to reduce the amount of sodium in your diet compared with what you might get in a more traditional diet, which can amount to a whopping 3,500 mg of sodium a day or more. That level is  far beyond the recommendation of the 2005 Dietary Guidelines for Americans of a maximum of 2,300 mg of sodium a day.   Studies show that the lower sodium version of the DASH diet is especially helpful in lowering blood pressure for adults who are middle-aged or older, for African-Americans and for those who already have high blood pressure. If you aren't sure which version of the DASH diet is best for you, talk to your doctor.   DASH diet: What to eat  Both sodium versions of the DASH diet include lots of whole grains, fruits, vegetables and low-fat dairy products. The DASH diet also includes some fish, poultry and legumes. You can eat red meat, sweets and fats in small amounts. The DASH diet is low in saturated fat, cholesterol and total fat.   Here's a look at the recommended servings from each food group for the 2,000-calorie-a-day DASH diet.   Grains (6 to 8 servings a day)  Grains include bread, cereal, rice and pasta. Examples of one serving of grains include 1 slice whole-wheat bread, 1 ounce (oz.) dry cereal, or 1/2 cup cooked cereal, rice or pasta.   Focus on whole grains because they have more fiber and nutrients than do refined grains. For instance, use brown rice instead of white rice, whole-wheat pasta instead of regular pasta and whole-grain bread instead of white bread. Look for products labeled "100 percent whole   grain" or "100 percent whole wheat."   Grains are naturally low in fat, so avoid spreading on butter or adding cream and cheese sauces.  Vegetables (4 to 5 servings a day)  Tomatoes, carrots, broccoli, sweet potatoes, greens and other vegetables are full of fiber, vitamins, and such minerals as potassium and magnesium. Examples of one serving include 1 cup raw leafy green vegetables or 1/2 cup cut-up raw or cooked vegetables.   Don't think of vegetables only as side dishes ??? a hearty blend of vegetables served over brown rice or whole-wheat noodles can serve as the main dish for a meal.    Fresh or frozen vegetables are both good choices. When buying frozen and canned vegetables, choose those labeled as low sodium or without added salt.   To increase the number of servings you fit in daily, be creative. In a stir-fry, for instance, cut the amount of meat in half and double up on the vegetables.  Fruits (4 to 5 servings a day)  Many fruits need little preparation to become a healthy part of a meal or snack. Like vegetables, they're packed with fiber, potassium and magnesium and are typically low in fat ??? exceptions include avocados and coconuts. Examples of one serving include 1 medium fruit or 1/2 cup fresh, frozen or canned fruit.   Have a piece of fruit with meals and one as a snack, then round out your day with a dessert of fresh fruits topped with a splash of low-fat yogurt.   Leave on edible peels whenever possible. The peels of apples, pears and most fruits with pits add interesting texture to recipes and contain healthy nutrients and fiber.   Remember that citrus fruits and juice, such as grapefruit, can interact with certain medications, so check with your doctor or pharmacist to see if they're OK for you.  Dairy (2 to 3 servings a day)  Milk, yogurt, cheese and other dairy products are major sources of calcium, vitamin D and protein. But the key is to make sure that you choose dairy products that are low-fat or fat-free because otherwise they can be a major source of fat. Examples of one serving include 1 cup skim or 1% milk, 1 cup yogurt or 1 1/2 oz. cheese.   Low-fat or fat-free frozen yogurt can help you boost the amount of dairy products you eat while offering a sweet treat. Add fruit for a healthy twist.   If you have trouble digesting dairy products, choose lactose-free products or consider taking an over-the-counter product that contains the enzyme lactase, which can reduce or prevent the symptoms of lactose intolerance.    Go easy on regular and even fat-free cheeses because they are typically high in sodium.  Lean meat, poultry and fish (6 or fewer servings a day)  Meat can be a rich source of protein, B vitamins, iron and zinc. But because even lean varieties contain fat and cholesterol, don't make them a mainstay of your diet ??? cut back typical meat portions by one-third or one-half and pile on the vegetables instead. Examples of one serving include 1 oz. cooked skinless poultry, seafood or lean meat, 1 egg, or 1 oz. water-packed, no-salt-added canned tuna.   Trim away skin and fat from meat and then broil, grill, roast or poach instead of frying.   Eat heart-healthy fish, such as salmon, herring and tuna. These types of fish are high in omega-3 fatty acids, which can help lower your total cholesterol.  Nuts, seeds   and legumes (4 to 5 servings a week)   Almonds, sunflower seeds, kidney beans, peas, lentils and other foods in this family are good sources of magnesium, potassium and protein. They're also full of fiber and phytochemicals, which are plant compounds that may protect against some cancers and cardiovascular disease. Serving sizes are small and are intended to be consumed weekly because these foods are high in calories. Examples of one serving include 1/3 cup (1 1/2 oz.) nuts, 2 tablespoons seeds or 1/2 cup cooked beans or peas.   Nuts sometimes get a bad rap because of their fat content, but they contain healthy types of fat ??? monounsaturated fat and omega-3 fatty acids. They're high in calories, however, so eat them in moderation. Try adding them to stir-fries, salads or cereals.   Soybean-based products, such as tofu and tempeh, can be a good alternative to meat because they contain all of the amino acids your body needs to make a complete protein, just like meat. They also contain isoflavones, a type of natural plant compound (phytochemical) that has been shown to have some health benefits.   Fats and oils (2 to 3 servings a day)  Fat helps your body absorb essential vitamins and helps your body's immune system. But too much fat increases your risk of heart disease, diabetes and obesity. The DASH diet strives for a healthy balance by providing 30 percent or less of daily calories from fat, with a focus on the healthier unsaturated fats. Examples of one serving include 1 teaspoon soft margarine, 1 tablespoon low-fat mayonnaise or 2 tablespoons light salad dressing.   Saturated fat and trans fat are the main dietary culprits in raising your blood cholesterol and increasing your risk of coronary artery disease. DASH helps keep your daily saturated fat to less than 10 percent of your total calories by limiting use of meat, butter, cheese, whole milk, cream and eggs in your diet, along with foods made from lard, solid shortenings, and palm and coconut oils.   Avoid trans fat, commonly found in such processed foods as crackers, baked goods and fried items.   Read food labels on margarine and salad dressing so that you can choose those that are lowest in saturated fat and free of trans fat.  Sweets (5 or fewer a week)  You don't have to banish sweets entirely while following the DASH diet ??? just go easy on them. Examples of one serving include 1 tablespoon sugar, jelly or jam, 1/2 cup sorbet or 1 cup (8 oz.) lemonade.   When you eat sweets, choose those that are fat-free or low-fat, such as sorbets, fruit ices, jelly beans, hard candy, graham crackers or low-fat cookies.   Artificial sweeteners such as aspartame (NutraSweet, Equal) and sucralose (Splenda) may help satisfy your sweet tooth while sparing the sugar. But remember that you still must use them sensibly. It's OK to swap a diet cola for a regular cola, but not in place of a more nutritious beverage such as low-fat milk or even plain water.   Cut back on added sugar, which has no nutritional value but can pack on calories.   DASH diet: Alcohol and caffeine  Drinking too much alcohol can increase blood pressure. The DASH diet recommends that men limit alcohol to two or fewer drinks a day and women one or less.   The DASH diet doesn't address caffeine consumption. The influence of caffeine on blood pressure remains unclear. But caffeine can cause your blood pressure to rise   at least temporarily. If you already have high blood pressure or if you think caffeine is affecting your blood pressure, talk to your doctor about your caffeine consumption.   DASH diet and weight loss  The DASH diet is not designed to promote weight loss, but it can be used as part of an overall weight-loss strategy. The DASH diet is based on a diet of about 2,000 calories a day. If you're trying to lose weight, though, you may want to eat around 1,600 a day. You may need to adjust your serving goals based on your health or individual circumstances ??? something your health care team can help you decide.   Tips to cut back on sodium  The foods at the core of the DASH diet are naturally low in sodium. So just by following the DASH diet, you're likely to reduce your sodium intake. You also can cut back on sodium in your diet by:   Using sodium-free spices or flavorings with your food instead of salt   Not adding salt when cooking rice, pasta or hot cereal   Rinsing canned foods to remove some of the sodium   Buying foods labeled "no salt added," "sodium-free," "low sodium" or "very low sodium"  One teaspoon of table salt has about 2,300 mg of sodium, and 2/3 teaspoon of table salt has about 1,500 mg of sodium. When you read food labels, you may be surprised at just how much sodium some processed foods contain. Even low-fat soups, canned vegetables, ready-to-eat cereals and sliced turkey from the local deli ??? all foods you may have considered healthy ??? often have lots of sodium.   You may not notice a difference in taste when you choose low-sodium food  and beverages. If things seem too bland, gradually introduce low-sodium foods and cut back on table salt until you reach your sodium goal. That'll give your palate time to adjust. It can take several weeks for your taste buds to get used to less salty foods.   Putting the pieces of the DASH diet together   Try these strategies to get started on the DASH diet:   Change gradually. To boost your success, avoid dramatic changes in your eating approach. Instead, change one or two things at a time. If you now eat only one or two servings of fruits or vegetables a day, try to add a serving at lunch and one at dinner. Rather than switching to all whole grains, start by making one or two of your grain servings whole grains. Increasing fruits, vegetables and whole grains gradually can also help prevent bloating or diarrhea that may occur if you aren't used to eating a diet with lots of fiber. You can also try over-the-counter products to help reduce gas from beans and vegetables.   Forgive yourself if you backslide. Everyone slips, especially when learning something new. Remember that changing your lifestyle is a long-term process. Find out what triggered your setback and then just pick up where you left off with the DASH diet.   Reward successes. Reward yourself with a nonfood treat for your accomplishments.   Add physical activity. To boost your blood pressure lowering efforts even more, consider increasing your physical activity in addition to following the DASH diet. Combining both the DASH diet and physical activity makes it more likely that you'll reduce your blood pressure.   Get support if you need it. If you're having trouble sticking to your diet, talk to your doctor or dietitian about   it. You might get some tips that will help you stick to the DASH diet.  Remember, healthy eating isn't an all-or-nothing proposition. What's most important is that, on average, you eat healthier foods with plenty of  variety ??? both to keep your diet nutritious and to avoid boredom or extremes. And with the DASH diet, you can have both.

## 2018-01-08 NOTE — Progress Notes (Signed)
Chief Complaint   Patient presents with   ??? Hypertension     Pt who presents for follow up of a pre-existing problem of hypertension.    Diet and Lifestyle: generally follows a low fat low cholesterol diet  Home BP Monitoring: is well controlled at home  Use of agents associated with hypertension: none.  Cardiovascular ROS: taking medications as instructed, no medication side effects noted, no TIA's, no chest pain on exertion, no dyspnea on exertion, no swelling of ankles.     New concerns: per patient he woke up with blisters on legs and he thinks its from blood pressure meds.     Reviewed and agree with Nurse Note and duplicated in this note.  Reviewed PmHx, RxHx, FmHx, SocHx, AllgHx and updated and dated in the chart.    Family History   Problem Relation Age of Onset   ??? Cancer Mother    ??? Heart Disease Mother    ??? Hypertension Mother    ??? Asthma Sister    ??? Migraines Maternal Aunt    ??? Cancer Maternal Uncle    ??? Migraines Maternal Uncle    ??? Hypertension Maternal Uncle    ??? Cancer Maternal Grandmother    ??? Migraines Maternal Grandmother    ??? Hypertension Maternal Grandmother        Past Medical History:   Diagnosis Date   ??? Arthritis    ??? Depression    ??? Depression 04/25/2016   ??? Depression 04/25/2016   ??? Essential hypertension 04/25/2016   ??? Essential hypertension 04/25/2016   ??? Heavy smoker 04/25/2016   ??? Heavy smoker 04/25/2016   ??? Hep C w/ coma, chronic (HCC) 04/25/2016   ??? Hep C w/ coma, chronic (HCC) 04/25/2016   ??? Hypertension    ??? Liver disease       Social History     Socioeconomic History   ??? Marital status: SINGLE     Spouse name: Not on file   ??? Number of children: Not on file   ??? Years of education: Not on file   ??? Highest education level: Not on file   Tobacco Use   ??? Smoking status: Current Some Day Smoker     Packs/day: 1.00     Years: 50.00     Pack years: 50.00   ??? Smokeless tobacco: Current User   Substance and Sexual Activity   ??? Alcohol use: No   ??? Drug use: Yes     Types: Marijuana    ??? Sexual activity: Yes     Partners: Female     Birth control/protection: Condom        Review of Systems - negative except as listed above      Objective:     Vitals:    01/08/18 0904   BP: 148/89   Pulse: 71   Temp: 98.3 ??F (36.8 ??C)   SpO2: 95%   Weight: 162 lb 14.4 oz (73.9 kg)       Physical Examination: General appearance - alert, well appearing, and in no distress  Eyes - pupils equal and reactive, extraocular eye movements intact  Ears - bilateral TM's and external ear canals normal  Nose - normal and patent, no erythema, discharge or polyps  Mouth - mucous membranes moist, pharynx normal without lesions  Neck - supple, no significant adenopathy  Chest - clear to auscultation, no wheezes, rales or rhonchi, symmetric air entry  Heart - normal rate, regular rhythm, normal S1, S2,  no murmurs, rubs, clicks or gallops  Abdomen - soft, nontender, nondistended, no masses or organomegaly  Musculoskeletal - no joint tenderness, deformity or swelling  Extremities - peripheral pulses normal, no pedal edema, no clubbing or cyanosis  Skin - normal coloration and turgor, no rashes, no suspicious skin lesions noted    Assessment/ Plan:   Diagnoses and all orders for this visit:    1. Essential hypertension    Other orders  -     irbesartan (AVAPRO) 150 mg tablet; Take 1 Tab by mouth nightly.     Patient currently taking half of a 150 mg tablet, we will increase to a whole tablet and have him come back for a nurse visit    Follow-up Disposition:  Return in about 2 weeks (around 01/22/2018) for Nurse BP check.        Medication Side Effects and Warnings were discussed with patient,  Patient Labs were reviewed and or requested, and  Patient Past Records were reviewed and or requested  yes       I have discussed the diagnosis with the patient and the intended plan as seen in the above orders.  The patient has received an after-visit summary and questions were answered concerning future plans.        Pt agrees to call or return to clinic and/or go to closest ER with any worsening of symptoms.  This may include, but not limited to increased fever (>100.4) with NSAIDS or Tylenol, increased edema, confusion, rash, worsening of presenting symptoms.

## 2018-01-22 ENCOUNTER — Encounter: Primary: Family Medicine

## 2018-02-02 ENCOUNTER — Ambulatory Visit: Admit: 2018-02-02 | Discharge: 2018-02-02 | Attending: Family Medicine | Primary: Family Medicine

## 2018-02-02 DIAGNOSIS — Z0000 Encounter for general adult medical examination without abnormal findings: Secondary | ICD-10-CM

## 2018-02-02 MED ORDER — IRBESARTAN 300 MG TAB
300 mg | ORAL_TABLET | Freq: Every evening | ORAL | 3 refills | Status: DC
Start: 2018-02-02 — End: 2018-04-21

## 2018-02-02 NOTE — Progress Notes (Signed)
Chief Complaint   Patient presents with   ??? Complete Physical     he is a 62 y.o. year old male who presents for CPE.  Complete Physical Exam Questions:    1.  Do you follow a low fat diet?  no  2.  Are you up to date on your Tdap (<10 years)?  Unknown  3.  Have you ever had a Pneumovax vaccine (>65)?  Unknown   PCV13 Not applicable   PPSV23 Not applicable  4.  Have you had Zoster vaccine (>60)? Not applicable  5.  Have you had the HPV - Gardasil (13- 26)? Not applicable  6.  Do you follow an exercise program?  yes  7.  Do you smoke?  no If > 65 and smoker, have you had a abdominal aortic aneurysm ultrasound screen?  No  8.  Do you consider yourself overweight?  yes  9.  Is there a family history of CAD< age 80?  Unknown  10.  Is there a family history of Cancer?  Yes  11.  Do you know your Cancer risks? Yes  12.  Have you had a colonoscopy?  No  13. Have you been tested for HIV or other STI's? Unknown HIV today(18-65 y/o)?No   14.  Have you had an EKG in the last five years(>50)?Yes  15.  Have you had a PSA test done this year (50-69)? Unknown    Other complaints: none    Reviewed and agree with Nurse Note and duplicated in this note.  Reviewed PmHx, RxHx, FmHx, SocHx, AllgHx and updated and dated in the chart.    Family History   Problem Relation Age of Onset   ??? Cancer Mother    ??? Heart Disease Mother    ??? Hypertension Mother    ??? Asthma Sister    ??? Migraines Maternal Aunt    ??? Cancer Maternal Uncle    ??? Migraines Maternal Uncle    ??? Hypertension Maternal Uncle    ??? Cancer Maternal Grandmother    ??? Migraines Maternal Grandmother    ??? Hypertension Maternal Grandmother        Past Medical History:   Diagnosis Date   ??? Arthritis    ??? Depression    ??? Depression 04/25/2016   ??? Depression 04/25/2016   ??? Essential hypertension 04/25/2016   ??? Essential hypertension 04/25/2016   ??? Heavy smoker 04/25/2016   ??? Heavy smoker 04/25/2016   ??? Hep C w/ coma, chronic (HCC) 04/25/2016   ??? Hep C w/ coma, chronic (HCC) 04/25/2016    ??? Hypertension    ??? Liver disease       Social History     Socioeconomic History   ??? Marital status: SINGLE     Spouse name: Not on file   ??? Number of children: Not on file   ??? Years of education: Not on file   ??? Highest education level: Not on file   Tobacco Use   ??? Smoking status: Current Some Day Smoker     Packs/day: 1.00     Years: 50.00     Pack years: 50.00   ??? Smokeless tobacco: Current User   Substance and Sexual Activity   ??? Alcohol use: No   ??? Drug use: Yes     Types: Marijuana   ??? Sexual activity: Yes     Partners: Female     Birth control/protection: Condom        Review of Systems -  negative except as listed above      Objective:     Vitals:    02/02/18 0951   BP: 138/87   Pulse: 74   Temp: 97.9 ??F (36.6 ??C)   SpO2: 95%   Weight: 159 lb 14.4 oz (72.5 kg)       Physical Examination: General appearance - alert, well appearing, and in no distress  Eyes - pupils equal and reactive, extraocular eye movements intact  Ears - bilateral TM's and external ear canals normal  Nose - normal and patent, no erythema, discharge or polyps  Mouth - mucous membranes moist, pharynx normal without lesions  Neck - supple, no significant adenopathy  Chest - clear to auscultation, no wheezes, rales or rhonchi, symmetric air entry  Heart - normal rate, regular rhythm, normal S1, S2, no murmurs, rubs, clicks or gallops  Abdomen - soft, nontender, nondistended, no masses or organomegaly  Neurological - alert, oriented, normal speech, no focal findings or movement disorder noted  Musculoskeletal - no joint tenderness, deformity or swelling  Extremities - peripheral pulses normal, no pedal edema, no clubbing or cyanosis  Skin - normal coloration and turgor, no rashes, no suspicious skin lesions noted      Assessment/ Plan:   Diagnoses and all orders for this visit:    1. Visit for well man health check  -     CBC W/O DIFF  -     LIPID PANEL  -     METABOLIC PANEL, COMPREHENSIVE  -     PSA W/ REFLX FREE PSA     2. Essential hypertension    3. Tinnitus of both ears  -     REFERRAL TO ENT-OTOLARYNGOLOGY    Other orders  -     irbesartan (AVAPRO) 300 mg tablet; Take 1 Tab by mouth nightly.         Labs to be drawn: CBC, CMP, Lipid            I have discussed the diagnosis with the patient and the intended plan as seen in the above orders.  The patient has received an after-visit summary and questions were answered concerning future plans.     Medication Side Effects and Warnings were discussed with patient,  Patient Labs were reviewed and or requested, and  Patient Past Records were reviewed and or requested  yes         Pt agrees to call or return to clinic and/or go to closest ER with any worsening of symptoms.  This may include, but not limited to increased fever (>100.4) with NSAIDS or Tylenol, increased edema, confusion, rash, worsening of presenting symptoms.

## 2018-02-02 NOTE — Patient Instructions (Signed)
DASH diet: Healthy eating to lower your blood pressure  The DASH diet emphasizes portion size, eating a variety of foods and getting the right amount of nutrients. Discover how DASH can improve your health and lower your blood pressure.     DASH stands for Dietary Approaches to Stop Hypertension. The DASH diet is a lifelong approach to healthy eating that's designed to help treat or prevent high blood pressure (hypertension). The DASH diet encourages you to reduce the sodium in your diet and eat a variety of foods rich in nutrients that help lower blood pressure, such as potassium, calcium and magnesium.   By following the DASH diet, you may be able to reduce your blood pressure by a few points in just two weeks. Over time, your blood pressure could drop by eight to 14 points, which can make a significant difference in your health risks.   Because the DASH diet is a healthy way of eating, it offers health benefits besides just lowering blood pressure. The DASH diet may offer protection against osteoporosis, cancer, heart disease, stroke and diabetes. And while the DASH diet is not a weight-loss program, you may indeed lose unwanted pounds because it can help guide you toward healthier meals and snacks.   DASH diet: Sodium levels  A key goal of the DASH diet is reducing how much sodium you eat, since sodium can dramatically increase blood pressure in people who are sensitive to its effects. In addition to the standard DASH diet, there is also a lower sodium version of the diet. You can choose the version of the diet that meets your health needs:   Standard DASH diet. You can consume up to 2,300 milligrams (mg) of sodium a day.   Lower sodium DASH diet. You can consume up to 1,500 mg of sodium a day.  Both versions of the DASH diet aim to reduce the amount of sodium in your diet compared with what you might get in a more traditional diet, which can amount to a whopping 3,500 mg of sodium a day or more. That level is  far beyond the recommendation of the 2005 Dietary Guidelines for Americans of a maximum of 2,300 mg of sodium a day.   Studies show that the lower sodium version of the DASH diet is especially helpful in lowering blood pressure for adults who are middle-aged or older, for African-Americans and for those who already have high blood pressure. If you aren't sure which version of the DASH diet is best for you, talk to your doctor.   DASH diet: What to eat  Both sodium versions of the DASH diet include lots of whole grains, fruits, vegetables and low-fat dairy products. The DASH diet also includes some fish, poultry and legumes. You can eat red meat, sweets and fats in small amounts. The DASH diet is low in saturated fat, cholesterol and total fat.   Here's a look at the recommended servings from each food group for the 2,000-calorie-a-day DASH diet.   Grains (6 to 8 servings a day)  Grains include bread, cereal, rice and pasta. Examples of one serving of grains include 1 slice whole-wheat bread, 1 ounce (oz.) dry cereal, or 1/2 cup cooked cereal, rice or pasta.   Focus on whole grains because they have more fiber and nutrients than do refined grains. For instance, use brown rice instead of white rice, whole-wheat pasta instead of regular pasta and whole-grain bread instead of white bread. Look for products labeled "100 percent whole   grain" or "100 percent whole wheat."   Grains are naturally low in fat, so avoid spreading on butter or adding cream and cheese sauces.  Vegetables (4 to 5 servings a day)  Tomatoes, carrots, broccoli, sweet potatoes, greens and other vegetables are full of fiber, vitamins, and such minerals as potassium and magnesium. Examples of one serving include 1 cup raw leafy green vegetables or 1/2 cup cut-up raw or cooked vegetables.   Don't think of vegetables only as side dishes ??? a hearty blend of vegetables served over brown rice or whole-wheat noodles can serve as the main dish for a meal.    Fresh or frozen vegetables are both good choices. When buying frozen and canned vegetables, choose those labeled as low sodium or without added salt.   To increase the number of servings you fit in daily, be creative. In a stir-fry, for instance, cut the amount of meat in half and double up on the vegetables.  Fruits (4 to 5 servings a day)  Many fruits need little preparation to become a healthy part of a meal or snack. Like vegetables, they're packed with fiber, potassium and magnesium and are typically low in fat ??? exceptions include avocados and coconuts. Examples of one serving include 1 medium fruit or 1/2 cup fresh, frozen or canned fruit.   Have a piece of fruit with meals and one as a snack, then round out your day with a dessert of fresh fruits topped with a splash of low-fat yogurt.   Leave on edible peels whenever possible. The peels of apples, pears and most fruits with pits add interesting texture to recipes and contain healthy nutrients and fiber.   Remember that citrus fruits and juice, such as grapefruit, can interact with certain medications, so check with your doctor or pharmacist to see if they're OK for you.  Dairy (2 to 3 servings a day)  Milk, yogurt, cheese and other dairy products are major sources of calcium, vitamin D and protein. But the key is to make sure that you choose dairy products that are low-fat or fat-free because otherwise they can be a major source of fat. Examples of one serving include 1 cup skim or 1% milk, 1 cup yogurt or 1 1/2 oz. cheese.   Low-fat or fat-free frozen yogurt can help you boost the amount of dairy products you eat while offering a sweet treat. Add fruit for a healthy twist.   If you have trouble digesting dairy products, choose lactose-free products or consider taking an over-the-counter product that contains the enzyme lactase, which can reduce or prevent the symptoms of lactose intolerance.    Go easy on regular and even fat-free cheeses because they are typically high in sodium.  Lean meat, poultry and fish (6 or fewer servings a day)  Meat can be a rich source of protein, B vitamins, iron and zinc. But because even lean varieties contain fat and cholesterol, don't make them a mainstay of your diet ??? cut back typical meat portions by one-third or one-half and pile on the vegetables instead. Examples of one serving include 1 oz. cooked skinless poultry, seafood or lean meat, 1 egg, or 1 oz. water-packed, no-salt-added canned tuna.   Trim away skin and fat from meat and then broil, grill, roast or poach instead of frying.   Eat heart-healthy fish, such as salmon, herring and tuna. These types of fish are high in omega-3 fatty acids, which can help lower your total cholesterol.  Nuts, seeds   and legumes (4 to 5 servings a week)   Almonds, sunflower seeds, kidney beans, peas, lentils and other foods in this family are good sources of magnesium, potassium and protein. They're also full of fiber and phytochemicals, which are plant compounds that may protect against some cancers and cardiovascular disease. Serving sizes are small and are intended to be consumed weekly because these foods are high in calories. Examples of one serving include 1/3 cup (1 1/2 oz.) nuts, 2 tablespoons seeds or 1/2 cup cooked beans or peas.   Nuts sometimes get a bad rap because of their fat content, but they contain healthy types of fat ??? monounsaturated fat and omega-3 fatty acids. They're high in calories, however, so eat them in moderation. Try adding them to stir-fries, salads or cereals.   Soybean-based products, such as tofu and tempeh, can be a good alternative to meat because they contain all of the amino acids your body needs to make a complete protein, just like meat. They also contain isoflavones, a type of natural plant compound (phytochemical) that has been shown to have some health benefits.   Fats and oils (2 to 3 servings a day)  Fat helps your body absorb essential vitamins and helps your body's immune system. But too much fat increases your risk of heart disease, diabetes and obesity. The DASH diet strives for a healthy balance by providing 30 percent or less of daily calories from fat, with a focus on the healthier unsaturated fats. Examples of one serving include 1 teaspoon soft margarine, 1 tablespoon low-fat mayonnaise or 2 tablespoons light salad dressing.   Saturated fat and trans fat are the main dietary culprits in raising your blood cholesterol and increasing your risk of coronary artery disease. DASH helps keep your daily saturated fat to less than 10 percent of your total calories by limiting use of meat, butter, cheese, whole milk, cream and eggs in your diet, along with foods made from lard, solid shortenings, and palm and coconut oils.   Avoid trans fat, commonly found in such processed foods as crackers, baked goods and fried items.   Read food labels on margarine and salad dressing so that you can choose those that are lowest in saturated fat and free of trans fat.  Sweets (5 or fewer a week)  You don't have to banish sweets entirely while following the DASH diet ??? just go easy on them. Examples of one serving include 1 tablespoon sugar, jelly or jam, 1/2 cup sorbet or 1 cup (8 oz.) lemonade.   When you eat sweets, choose those that are fat-free or low-fat, such as sorbets, fruit ices, jelly beans, hard candy, graham crackers or low-fat cookies.   Artificial sweeteners such as aspartame (NutraSweet, Equal) and sucralose (Splenda) may help satisfy your sweet tooth while sparing the sugar. But remember that you still must use them sensibly. It's OK to swap a diet cola for a regular cola, but not in place of a more nutritious beverage such as low-fat milk or even plain water.   Cut back on added sugar, which has no nutritional value but can pack on calories.   DASH diet: Alcohol and caffeine  Drinking too much alcohol can increase blood pressure. The DASH diet recommends that men limit alcohol to two or fewer drinks a day and women one or less.   The DASH diet doesn't address caffeine consumption. The influence of caffeine on blood pressure remains unclear. But caffeine can cause your blood pressure to rise   at least temporarily. If you already have high blood pressure or if you think caffeine is affecting your blood pressure, talk to your doctor about your caffeine consumption.   DASH diet and weight loss  The DASH diet is not designed to promote weight loss, but it can be used as part of an overall weight-loss strategy. The DASH diet is based on a diet of about 2,000 calories a day. If you're trying to lose weight, though, you may want to eat around 1,600 a day. You may need to adjust your serving goals based on your health or individual circumstances ??? something your health care team can help you decide.   Tips to cut back on sodium  The foods at the core of the DASH diet are naturally low in sodium. So just by following the DASH diet, you're likely to reduce your sodium intake. You also can cut back on sodium in your diet by:   Using sodium-free spices or flavorings with your food instead of salt   Not adding salt when cooking rice, pasta or hot cereal   Rinsing canned foods to remove some of the sodium   Buying foods labeled "no salt added," "sodium-free," "low sodium" or "very low sodium"  One teaspoon of table salt has about 2,300 mg of sodium, and 2/3 teaspoon of table salt has about 1,500 mg of sodium. When you read food labels, you may be surprised at just how much sodium some processed foods contain. Even low-fat soups, canned vegetables, ready-to-eat cereals and sliced turkey from the local deli ??? all foods you may have considered healthy ??? often have lots of sodium.   You may not notice a difference in taste when you choose low-sodium food  and beverages. If things seem too bland, gradually introduce low-sodium foods and cut back on table salt until you reach your sodium goal. That'll give your palate time to adjust. It can take several weeks for your taste buds to get used to less salty foods.   Putting the pieces of the DASH diet together   Try these strategies to get started on the DASH diet:   Change gradually. To boost your success, avoid dramatic changes in your eating approach. Instead, change one or two things at a time. If you now eat only one or two servings of fruits or vegetables a day, try to add a serving at lunch and one at dinner. Rather than switching to all whole grains, start by making one or two of your grain servings whole grains. Increasing fruits, vegetables and whole grains gradually can also help prevent bloating or diarrhea that may occur if you aren't used to eating a diet with lots of fiber. You can also try over-the-counter products to help reduce gas from beans and vegetables.   Forgive yourself if you backslide. Everyone slips, especially when learning something new. Remember that changing your lifestyle is a long-term process. Find out what triggered your setback and then just pick up where you left off with the DASH diet.   Reward successes. Reward yourself with a nonfood treat for your accomplishments.   Add physical activity. To boost your blood pressure lowering efforts even more, consider increasing your physical activity in addition to following the DASH diet. Combining both the DASH diet and physical activity makes it more likely that you'll reduce your blood pressure.   Get support if you need it. If you're having trouble sticking to your diet, talk to your doctor or dietitian about   it. You might get some tips that will help you stick to the DASH diet.  Remember, healthy eating isn't an all-or-nothing proposition. What's most important is that, on average, you eat healthier foods with plenty of  variety ??? both to keep your diet nutritious and to avoid boredom or extremes. And with the DASH diet, you can have both.

## 2018-02-02 NOTE — Addendum Note (Signed)
Addended by: Cassell ClementBALA, Zahlia Deshazer K on: 03/16/2018 04:16 PM     Modules accepted: Orders

## 2018-02-03 LAB — METABOLIC PANEL, COMPREHENSIVE
A-G Ratio: 1.4 (ref 1.2–2.2)
ALT (SGPT): 12 IU/L (ref 0–44)
AST (SGOT): 22 IU/L (ref 0–40)
Albumin: 4.3 g/dL (ref 3.6–4.8)
Alk. phosphatase: 92 IU/L (ref 39–117)
BUN/Creatinine ratio: 9 — ABNORMAL LOW (ref 10–24)
BUN: 12 mg/dL (ref 8–27)
Bilirubin, total: 0.4 mg/dL (ref 0.0–1.2)
CO2: 21 mmol/L (ref 20–29)
Calcium: 9.1 mg/dL (ref 8.6–10.2)
Chloride: 104 mmol/L (ref 96–106)
Creatinine: 1.27 mg/dL (ref 0.76–1.27)
GFR est AA: 69 mL/min/{1.73_m2} (ref >59)
GFR est non-AA: 59 mL/min/{1.73_m2} — ABNORMAL LOW (ref >59)
GLOBULIN, TOTAL: 3 g/dL (ref 1.5–4.5)
Glucose: 76 mg/dL (ref 65–99)
Potassium: 4.9 mmol/L (ref 3.5–5.2)
Protein, total: 7.3 g/dL (ref 6.0–8.5)
Sodium: 142 mmol/L (ref 134–144)

## 2018-02-03 LAB — CBC W/O DIFF
HCT: 45.3 % (ref 37.5–51.0)
HGB: 15.1 g/dL (ref 13.0–17.7)
MCH: 30.6 pg (ref 26.6–33.0)
MCHC: 33.3 g/dL (ref 31.5–35.7)
MCV: 92 fL (ref 79–97)
PLATELET: 147 10*3/uL — ABNORMAL LOW (ref 150–379)
RBC: 4.93 x10E6/uL (ref 4.14–5.80)
RDW: 14.4 % (ref 12.3–15.4)
WBC: 5.9 10*3/uL (ref 3.4–10.8)

## 2018-02-03 LAB — LIPID PANEL
Cholesterol, total: 165 mg/dL (ref 100–199)
HDL Cholesterol: 41 mg/dL (ref >39)
LDL, calculated: 100 mg/dL — ABNORMAL HIGH (ref 0–99)
Triglyceride: 118 mg/dL (ref 0–149)
VLDL, calculated: 24 mg/dL (ref 5–40)

## 2018-02-03 LAB — PSA W/ REFLX FREE PSA: Prostate Specific Ag: 0.2 ng/mL (ref 0.0–4.0)

## 2018-02-03 MED ORDER — LOSARTAN 100 MG TAB
100 mg | ORAL_TABLET | Freq: Every day | ORAL | 3 refills | Status: DC
Start: 2018-02-03 — End: 2018-07-29

## 2018-02-03 NOTE — Telephone Encounter (Signed)
Irbesartan on recall

## 2018-02-16 ENCOUNTER — Encounter: Primary: Family Medicine

## 2018-02-23 MED ORDER — BUPROPION SR 150 MG TAB
150 mg | ORAL_TABLET | ORAL | 1 refills | Status: DC
Start: 2018-02-23 — End: 2018-05-26

## 2018-04-21 ENCOUNTER — Ambulatory Visit: Attending: Family Medicine | Primary: Family Medicine

## 2018-04-21 ENCOUNTER — Ambulatory Visit: Admit: 2018-04-21 | Discharge: 2018-04-21 | Attending: Family Medicine | Primary: Family Medicine

## 2018-04-21 DIAGNOSIS — R55 Syncope and collapse: Secondary | ICD-10-CM

## 2018-04-21 MED ORDER — VARENICLINE 0.5 MG (11)-1 MG (3X14) TABS IN A DOSE PACK
0.5 mg (11)- 1 mg (42) | ORAL | 2 refills | Status: DC
Start: 2018-04-21 — End: 2018-05-26

## 2018-04-21 NOTE — Addendum Note (Signed)
Addended by: Cassell ClementBALA, Ronae Noell K on: 04/21/2018 11:22 AM     Modules accepted: Orders

## 2018-04-21 NOTE — Progress Notes (Signed)
Chief Complaint   Patient presents with   ??? Hypertension     Pt who presents for follow up of a pre-existing problem of hypertension.    Diet and Lifestyle: not attempting to follow a low fat, low cholesterol diet, not attempting to follow a low sodium diet, exercises regularly, smoker daily  Home BP Monitoring: is not measured at home  Use of agents associated with hypertension: none.  Cardiovascular ROS: taking medications as instructed, no medication side effects noted, no TIA's, no chest pain on exertion, no dyspnea on exertion, no swelling of ankles.     New concerns: passing out once a year.  Patient states that he had 2 episodes of syncopal episodes over the last 2 years.  Patient states that the first 1 was at [facility] while he was standing up he just gave out and fainted to the floor.  More recently 1 month ago he was sitting playing cards a table and his friend said he just told that over and passed out.  Fainting episodes lasted about 5 minutes and to he recovered.  He was taken to hospital which came with normal work-up at that time.  Denies any chest pain shortness of breath or DOE.  Orthostatics in office today are within normal range    Reviewed and agree with Nurse Note and duplicated in this note.  Reviewed PmHx, RxHx, FmHx, SocHx, AllgHx and updated and dated in the chart.    Family History   Problem Relation Age of Onset   ??? Cancer Mother    ??? Heart Disease Mother    ??? Hypertension Mother    ??? Asthma Sister    ??? Migraines Maternal Aunt    ??? Cancer Maternal Uncle    ??? Migraines Maternal Uncle    ??? Hypertension Maternal Uncle    ??? Cancer Maternal Grandmother    ??? Migraines Maternal Grandmother    ??? Hypertension Maternal Grandmother        Past Medical History:   Diagnosis Date   ??? Arthritis    ??? Depression    ??? Depression 04/25/2016   ??? Depression 04/25/2016   ??? Essential hypertension 04/25/2016   ??? Essential hypertension 04/25/2016   ??? Heavy smoker 04/25/2016   ??? Heavy smoker 04/25/2016   ??? Hep C w/ coma,  chronic (HCC) 04/25/2016   ??? Hep C w/ coma, chronic (HCC) 04/25/2016   ??? Hypertension    ??? Liver disease       Social History     Socioeconomic History   ??? Marital status: SINGLE     Spouse name: Not on file   ??? Number of children: Not on file   ??? Years of education: Not on file   ??? Highest education level: Not on file   Tobacco Use   ??? Smoking status: Current Some Day Smoker     Packs/day: 1.00     Years: 50.00     Pack years: 50.00   ??? Smokeless tobacco: Current User   Substance and Sexual Activity   ??? Alcohol use: No   ??? Drug use: Yes     Types: Marijuana   ??? Sexual activity: Yes     Partners: Female     Birth control/protection: Condom        Review of Systems - negative except as listed above      Objective:     Vitals:    04/21/18 1106   BP: 129/77   Pulse: 73  Resp: 16   Temp: 98.1 ??F (36.7 ??C)   TempSrc: Oral   SpO2: 97%   Weight: 160 lb (72.6 kg)   Height: 5\' 10"  (1.778 m)       Physical Examination: General appearance - alert, well appearing, and in no distress  Eyes - pupils equal and reactive, extraocular eye movements intact  Ears - bilateral TM's and external ear canals normal  Nose - normal and patent, no erythema, discharge or polyps  Mouth - mucous membranes moist, pharynx normal without lesions  Neck - supple, no significant adenopathy  Chest - clear to auscultation, no wheezes, rales or rhonchi, symmetric air entry  Heart - normal rate, regular rhythm, normal S1, S2, no murmurs, rubs, clicks or gallops  Abdomen - soft, nontender, nondistended, no masses or organomegaly  Extremities - peripheral pulses normal, no pedal edema, no clubbing or cyanosis  Skin - normal coloration and turgor, no rashes, no suspicious skin lesions noted    Assessment/ Plan:   Diagnoses and all orders for this visit:    1. Syncope, unspecified syncope type    Other orders  -     varenicline (CHANTIX STARTER PAK) 0.5 mg (11)- 1 mg (42) DsPk; Take as directed            Medication Side Effects and Warnings were discussed with  patient,  Patient Labs were reviewed and or requested, and  Patient Past Records were reviewed and or requested  yes       I have discussed the diagnosis with the patient and the intended plan as seen in the above orders.  The patient has received an after-visit summary and questions were answered concerning future plans.       Pt agrees to call or return to clinic and/or go to closest ER with any worsening of symptoms.  This may include, but not limited to increased fever (>100.4) with NSAIDS or Tylenol, increased edema, confusion, rash, worsening of presenting symptoms.

## 2018-04-21 NOTE — Addendum Note (Signed)
Addendum  Note by Cassell ClementBala, Gregg Winchell K, MD at 04/21/18 1030                Author: Cassell ClementBala, Lazarus Sudbury K, MD  Service: --  Author Type: Physician       Filed: 04/21/18 1122  Encounter Date: 04/21/2018  Status: Signed          Editor: Cassell ClementBala, Daelyn Mozer K, MD (Physician)          Addended by: Cassell ClementBALA, Christpoher Sievers K on: 04/21/2018 11:22 AM    Modules accepted: Orders

## 2018-04-21 NOTE — Progress Notes (Signed)
Chief Complaint   Patient presents with   ??? Hypertension     Pt who presents for follow up of a pre-existing problem of hypertension.    Diet and Lifestyle: not attempting to follow a low fat, low cholesterol diet, not attempting to follow a low sodium diet, exercises regularly, smoker daily  Home BP Monitoring: is not measured at home  Use of agents associated with hypertension: none.  Cardiovascular ROS: taking medications as instructed, no medication side effects noted, no TIA's, no chest pain on exertion, no dyspnea on exertion, no swelling of ankles.     New concerns: passing out once a year.  Patient states that he had 2 episodes of syncopal episodes over the last 2 years.  Patient states that the first 1 was at [facility] while he was standing up he just gave out and fainted to the floor.  More recently 1 month ago he was sitting playing cards a table and his friend said he just told that over and passed out.  Fainting episodes lasted about 5 minutes and to he recovered.  He was taken to hospital which came with normal work-up at that time.  Denies any chest pain shortness of breath or DOE.  Orthostatics in office today are within normal range    Reviewed and agree with Nurse Note and duplicated in this note.  Reviewed PmHx, RxHx, FmHx, SocHx, AllgHx and updated and dated in the chart.    Family History   Problem Relation Age of Onset   ??? Cancer Mother    ??? Heart Disease Mother    ??? Hypertension Mother    ??? Asthma Sister    ??? Migraines Maternal Aunt    ??? Cancer Maternal Uncle    ??? Migraines Maternal Uncle    ??? Hypertension Maternal Uncle    ??? Cancer Maternal Grandmother    ??? Migraines Maternal Grandmother    ??? Hypertension Maternal Grandmother        Past Medical History:   Diagnosis Date   ??? Arthritis    ??? Depression    ??? Depression 04/25/2016   ??? Depression 04/25/2016   ??? Essential hypertension 04/25/2016   ??? Essential hypertension 04/25/2016   ??? Heavy smoker 04/25/2016   ??? Heavy smoker 04/25/2016    ??? Hep C w/ coma, chronic (HCC) 04/25/2016   ??? Hep C w/ coma, chronic (HCC) 04/25/2016   ??? Hypertension    ??? Liver disease       Social History     Socioeconomic History   ??? Marital status: SINGLE     Spouse name: Not on file   ??? Number of children: Not on file   ??? Years of education: Not on file   ??? Highest education level: Not on file   Tobacco Use   ??? Smoking status: Current Some Day Smoker     Packs/day: 1.00     Years: 50.00     Pack years: 50.00   ??? Smokeless tobacco: Current User   Substance and Sexual Activity   ??? Alcohol use: No   ??? Drug use: Yes     Types: Marijuana   ??? Sexual activity: Yes     Partners: Female     Birth control/protection: Condom        Review of Systems - negative except as listed above      Objective:     Vitals:    04/21/18 1106   BP: 129/77   Pulse: 73  Resp: 16   Temp: 98.1 ??F (36.7 ??C)   TempSrc: Oral   SpO2: 97%   Weight: 160 lb (72.6 kg)   Height: 5\' 10"  (1.778 m)       Physical Examination: General appearance - alert, well appearing, and in no distress  Eyes - pupils equal and reactive, extraocular eye movements intact  Ears - bilateral TM's and external ear canals normal  Nose - normal and patent, no erythema, discharge or polyps  Mouth - mucous membranes moist, pharynx normal without lesions  Neck - supple, no significant adenopathy  Chest - clear to auscultation, no wheezes, rales or rhonchi, symmetric air entry  Heart - normal rate, regular rhythm, normal S1, S2, no murmurs, rubs, clicks or gallops  Abdomen - soft, nontender, nondistended, no masses or organomegaly  Extremities - peripheral pulses normal, no pedal edema, no clubbing or cyanosis  Skin - normal coloration and turgor, no rashes, no suspicious skin lesions noted    Assessment/ Plan:   Diagnoses and all orders for this visit:    1. Syncope, unspecified syncope type    Other orders  -     varenicline (CHANTIX STARTER PAK) 0.5 mg (11)- 1 mg (42) DsPk; Take as directed             Medication Side Effects and Warnings were discussed with patient,  Patient Labs were reviewed and or requested, and  Patient Past Records were reviewed and or requested  yes       I have discussed the diagnosis with the patient and the intended plan as seen in the above orders.  The patient has received an after-visit summary and questions were answered concerning future plans.       Pt agrees to call or return to clinic and/or go to closest ER with any worsening of symptoms.  This may include, but not limited to increased fever (>100.4) with NSAIDS or Tylenol, increased edema, confusion, rash, worsening of presenting symptoms.

## 2018-05-26 ENCOUNTER — Encounter

## 2018-05-26 ENCOUNTER — Ambulatory Visit
Admit: 2018-05-26 | Discharge: 2018-05-26 | Payer: PRIVATE HEALTH INSURANCE | Attending: Interventional Cardiology | Primary: Family Medicine

## 2018-05-26 ENCOUNTER — Institutional Professional Consult (permissible substitution): Admit: 2018-05-26 | Discharge: 2018-05-26 | Payer: PRIVATE HEALTH INSURANCE | Primary: Family Medicine

## 2018-05-26 DIAGNOSIS — R55 Syncope and collapse: Secondary | ICD-10-CM

## 2018-05-26 NOTE — Progress Notes (Signed)
Inform him monitor is k

## 2018-05-26 NOTE — Progress Notes (Signed)
Garret Reddish DNP, ANP-BC  Subjective/HPI:     Shawn Jackson is a 62 y.o. male is here for new patient consultation regarding syncopal episode.  Patient reports 2 syncopal episodes in 1 year.  Was admitted to [facility] April 26, 2016 for a witnessed syncopal episode.  He was standing on line in the cafeteria collapsed, stood back up collapsed again.  He was admitted to the CDU, reviewed limited records from [facility] online showing normal head CT, labs are stable it was treated for dehydration.  Most recently had a syncopal episode while in a seated position playing cards, positive loss of consciousness nearly 5 minutes according to patient/bystanders.  He was seen at [facility] and discharged, I do not have records regarding this at this time.  He does recall having a burn to his left hand 1 hour prior to the event and states he was in a fair amount of pain.  He states he remains well-hydrated however does not drink much water, consumes large amount of milk juice and coffee.  On both days of syncope he states he had his normal p.o. intake, no changes or new medications.  In review of systems he does admit to some dyspnea on exertion dependent on the level activity.  He denies exertional chest pain.  History of atherosclerotic heart disease, + smoker, + hypertension.    PCP Provider  Elgie Collard, MD  Past Medical History:   Diagnosis Date   ??? Arthritis    ??? Depression    ??? Depression 04/25/2016   ??? Depression 04/25/2016   ??? Essential hypertension 04/25/2016   ??? Essential hypertension 04/25/2016   ??? Heavy smoker 04/25/2016   ??? Heavy smoker 04/25/2016   ??? Hep C w/ coma, chronic ([place]) 04/25/2016   ??? Hep C w/ coma, chronic ([place]) 04/25/2016   ??? Hypertension    ??? Liver disease       Past Surgical History:   Procedure Laterality Date   ??? COLONOSCOPY N/A 04/29/2017    COLONOSCOPY performed by Blima Rich, [place] at [place]   ??? HX ORTHOPAEDIC      right leg   ??? HX OTHER SURGICAL      abscess     No Known Allergies   Family History    Problem Relation Age of Onset   ??? Cancer Mother    ??? Heart Disease Mother    ??? Hypertension Mother    ??? Asthma Sister    ??? Migraines Maternal Aunt    ??? Cancer Maternal Uncle    ??? Migraines Maternal Uncle    ??? Hypertension Maternal Uncle    ??? Cancer Maternal Grandmother    ??? Migraines Maternal Grandmother    ??? Hypertension Maternal Grandmother       Current Outpatient Medications   Medication Sig   ??? losartan (COZAAR) 100 mg tablet Take 1 Tab by mouth daily.     No current facility-administered medications for this visit.       Vitals:    05/26/18 0953 05/26/18 1005 05/26/18 1006   BP: 130/80 120/80 132/82   Pulse: 67     Resp: 18     SpO2: 98%     Weight: 158 lb 8 oz (71.9 kg)     Height: _0  (1.778 m)       Social History     Socioeconomic History   ??? Marital status: SINGLE     Spouse name: Not on file   ???  Number of children: Not on file   ??? Years of education: Not on file   ??? Highest education level: Not on file   Occupational History   ??? Not on file   Social Needs   ??? Financial resource strain: Not on file   ??? Food insecurity:     Worry: Not on file     Inability: Not on file   ??? Transportation needs:     Medical: Not on file     Non-medical: Not on file   Tobacco Use   ??? Smoking status: Current Some Day Smoker     Packs/day: 1.00     Years: 50.00     Pack years: 50.00   ??? Smokeless tobacco: Current User   Substance and Sexual Activity   ??? Alcohol use: No   ??? Drug use: Yes     Types: Marijuana   ??? Sexual activity: Yes     Partners: Female     Birth control/protection: Condom   Lifestyle   ??? Physical activity:     Days per week: Not on file     Minutes per session: Not on file   ??? Stress: Not on file   Relationships   ??? Social connections:     Talks on phone: Not on file     Gets together: Not on file     Attends religious service: Not on file     Active member of club or organization: Not on file     Attends meetings of clubs or organizations: Not on file     Relationship status: Not on file    ??? Intimate partner violence:     Fear of current or ex partner: Not on file     Emotionally abused: Not on file     Physically abused: Not on file     Forced sexual activity: Not on file   Other Topics Concern   ??? Not on file   Social History Narrative   ??? Not on file       I have reviewed the nurses notes, vitals, problem list, allergy list, medical history, family, social history and medications.    Review of Symptoms:    General: Pt denies excessive weight gain or loss. Pt is able to conduct ADL's  HEENT: Denies blurred vision, headaches, epistaxis and difficulty swallowing.  Respiratory: Denies shortness of breath,+ DOE, no wheezing or stridor.  Cardiovascular: Denies precordial pain, palpitations, edema or PND  Gastrointestinal: Denies poor appetite, indigestion, abdominal pain or blood in stool  Musculoskeletal: Denies pain or swelling from muscles or joints  Neurologic: Denies tremor, paresthesias, or sensory motor disturbance  Skin: Denies rash, itching or texture change.      Physical Exam: ??    General: Well developed, in no acute distress, cooperative and alert  HEENT: No carotid bruits, no JVD, trach is midline. Neck Supple, PEERL, EOM intact.  Heart: ??Normal S1/S2 negative S3 or S4. Regular, no murmur, gallop or rub.??  Respiratory: Clear bilaterally x 4, no wheezing or rales  Abdomen:?? ??Soft, non-tender, no masses, bowel sounds are active.??  Extremities:  No edema, normal cap refill, no cyanosis, atraumatic.   Neuro: A&Ox3, speech clear, gait stable.   Skin: Skin color is normal. No rashes or lesions. Non diaphoretic  Vascular: 2+ pulses symmetric in all extremities    Cardiographics    ECG: nsr      Cardiology Labs:  Lab Results   Component Value Date/Time  Cholesterol, total 165 02/02/2018 10:42 AM    HDL Cholesterol 41 02/02/2018 10:42 AM    LDL, calculated 100 (H) 02/02/2018 10:42 AM    Triglyceride 118 02/02/2018 10:42 AM       Lab Results   Component Value Date/Time     Sodium 142 02/02/2018 10:42 AM    Potassium 4.9 02/02/2018 10:42 AM    Chloride 104 02/02/2018 10:42 AM    CO2 21 02/02/2018 10:42 AM    Glucose 76 02/02/2018 10:42 AM    BUN 12 02/02/2018 10:42 AM    Creatinine 1.27 02/02/2018 10:42 AM    BUN/Creatinine ratio 9 (L) 02/02/2018 10:42 AM    GFR est AA 69 02/02/2018 10:42 AM    GFR est non-AA 59 (L) 02/02/2018 10:42 AM    Calcium 9.1 02/02/2018 10:42 AM    Bilirubin, total 0.4 02/02/2018 10:42 AM    AST (SGOT) 22 02/02/2018 10:42 AM    Alk. phosphatase 92 02/02/2018 10:42 AM    Protein, total 7.3 02/02/2018 10:42 AM    Albumin 4.3 02/02/2018 10:42 AM    A-G Ratio 1.4 02/02/2018 10:42 AM    ALT (SGPT) 12 02/02/2018 10:42 AM           Assessment:     Assessment:     Diagnoses and all orders for this visit:    1. Syncope, unspecified syncope type  -     AMB POC EKG ROUTINE W/ 12 LEADS, INTER & REP  -     DUPLEX CAROTID BILATERAL; Future  -     ECHO ADULT COMPLETE; Future  -     EXERCISE CARDIAC STRESS TEST; Future  -     LOOP MONITOR; Future    2. Essential hypertension  -     DUPLEX CAROTID BILATERAL; Future  -     ECHO ADULT COMPLETE; Future  -     EXERCISE CARDIAC STRESS TEST; Future  -     LOOP MONITOR; Future    3. DOE (dyspnea on exertion)        ICD-10-CM ICD-9-CM    1. Syncope, unspecified syncope type R55 780.2 AMB POC EKG ROUTINE W/ 12 LEADS, INTER & REP      DUPLEX CAROTID BILATERAL      ECHO ADULT COMPLETE      EXERCISE CARDIAC STRESS TEST      LOOP MONITOR   2. Essential hypertension I10 401.9 DUPLEX CAROTID BILATERAL      ECHO ADULT COMPLETE      EXERCISE CARDIAC STRESS TEST      LOOP MONITOR   3. DOE (dyspnea on exertion) R06.09 786.09      Orders Placed This Encounter   ??? LOOP MONITOR, Clinic Performed     Standing Status:   Future     Standing Expiration Date:   11/26/2018     Order Specific Question:   Reason for Exam:     Answer:   syncope   ??? AMB POC EKG ROUTINE W/ 12 LEADS, INTER & REP     Order Specific Question:   Reason for Exam:      Answer:   routine        Plan:     Patient is a 62 year old male referred to cardiology for evaluation of 2 syncopal episodes of unknown cause.  Will evaluate for structural heart defects with transthoracic echocardiogram, ischemia with routine exercise stress test.  History of long-term tobacco abuse will evaluate for carotid stenosis with carotid  duplex, 4-week event monitor applied in clinic today.  If all testing is normal no further work-up needed unless there is a recurrent syncopal episode;  then he would require ILR placement.    Garret Reddish, NP    This note was created using voice recognition software. Despite editing, there may be syntax errors.       Patient seen and examined by me with nurse practitioner. ??I personally performed all components of the history, physical, and medical decision making and agree with the assessment and plan with minor modifications as noted.    Angelique Holm, MD

## 2018-05-26 NOTE — Progress Notes (Signed)
Chief Complaint   Patient presents with   ??? New Patient     referred by PCP due to syncope     1. Have you been to the ER, urgent care clinic since your last visit?  Hospitalized since your last visit?Seen at [facility] 03/2018 due to syncope      2. Have you seen or consulted any other health care providers outside of the [facility] since your last visit?  Include any pap smears or colon screening. No

## 2018-05-26 NOTE — Progress Notes (Signed)
 Chief Complaint   Patient presents with   . New Patient     referred by PCP due to syncope     1. Have you been to the ER, urgent care clinic since your last visit?  Hospitalized since your last visit?Seen at [facility] 03/2018 due to syncope      2. Have you seen or consulted any other health care providers outside of the [facility] since your last visit?  Include any pap smears or colon screening. No

## 2018-05-26 NOTE — Progress Notes (Signed)
Progress Notes by Angelique Holm, MD at 05/26/18 1000                Author: Angelique Holm, MD  Service: --  Author Type: Physician       Filed: 05/26/18 1040  Encounter Date: 05/26/2018  Status: Signed          Editor: Angelique Holm, MD (Physician)                            Garret Reddish DNP, ANP-BC   Subjective/HPI:       Shawn Jackson is a 62 y.o.  male is here for new patient consultation regarding syncopal episode.  Patient reports 2 syncopal episodes in 1 year.  Was admitted to [facility] April 26, 2016 for a witnessed syncopal episode.  He was standing on line in the cafeteria collapsed, stood back up collapsed again.  He was admitted to the CDU, reviewed limited records from [facility] online showing normal head CT, labs are stable it was treated  for dehydration.  Most recently had a syncopal episode while in a seated position playing cards, positive loss of consciousness nearly 5 minutes according to patient/bystanders.  He was seen at [facility] and discharged, I do not have records regarding  this at this time.  He does recall having a burn to his left hand 1 hour prior to the event and states he was in a fair amount of pain.  He states he remains well-hydrated however does not drink much water, consumes large amount of milk juice and coffee.   On both days of syncope he states he had his normal p.o. intake, no changes or new medications.   In review of systems he does admit to some dyspnea on exertion dependent on the level activity.  He denies exertional chest pain.  History of atherosclerotic heart disease, + smoker, + hypertension.      PCP Provider   Elgie Collard, MD     Past Medical History:        Diagnosis  Date         ?  Arthritis       ?  Depression       ?  Depression  04/25/2016     ?  Depression  04/25/2016     ?  Essential hypertension  04/25/2016     ?  Essential hypertension  04/25/2016     ?  Heavy smoker  04/25/2016     ?  Heavy smoker  04/25/2016     ?  Hep C  w/ coma, chronic ([place])  04/25/2016     ?  Hep C w/ coma, chronic ([place])  04/25/2016     ?  Hypertension           ?  Liver disease             Past Surgical History:         Procedure  Laterality  Date          ?  COLONOSCOPY  N/A  04/29/2017          COLONOSCOPY performed by Blima Rich, [place] at [place]          ?  HX ORTHOPAEDIC              right leg          ?  HX OTHER  SURGICAL              abscess        No Known Allergies      Family History         Problem  Relation  Age of Onset          ?  Cancer  Mother       ?  Heart Disease  Mother       ?  Hypertension  Mother       ?  Asthma  Sister       ?  Migraines  Maternal Aunt       ?  Cancer  Maternal Uncle       ?  Migraines  Maternal Uncle       ?  Hypertension  Maternal Uncle       ?  Cancer  Maternal Grandmother       ?  Migraines  Maternal Grandmother            ?  Hypertension  Maternal Grandmother             Current Outpatient Medications        Medication  Sig         ?  losartan (COZAAR) 100 mg tablet  Take 1 Tab by mouth daily.          No current facility-administered medications for this visit.            Vitals:            05/26/18 0953  05/26/18 1005  05/26/18 1006          BP:  130/80  120/80  132/82     Pulse:  67         Resp:  18         SpO2:  98%         Weight:  158 lb 8 oz (71.9 kg)              Height:  5' 10" (1.778 m)              Social History          Socioeconomic History         ?  Marital status:  SINGLE              Spouse name:  Not on file         ?  Number of children:  Not on file     ?  Years of education:  Not on file     ?  Highest education level:  Not on file       Occupational History        ?  Not on file       Social Needs         ?  Financial resource strain:  Not on file        ?  Food insecurity:              Worry:  Not on file         Inability:  Not on file        ?  Transportation needs:              Medical:  Not on file         Non-medical:  Not on file       Tobacco Use         ?  Smoking status:  Current  Some Day Smoker              Packs/day:  1.00         Years:  50.00         Pack years:  50.00         ?  Smokeless tobacco:  Current User       Substance and Sexual Activity         ?  Alcohol use:  No     ?  Drug use:  Yes              Types:  Marijuana         ?  Sexual activity:  Yes              Partners:  Female         Birth control/protection:  Condom       Lifestyle        ?  Physical activity:              Days per week:  Not on file         Minutes per session:  Not on file         ?  Stress:  Not on file       Relationships        ?  Social connections:              Talks on phone:  Not on file         Gets together:  Not on file         Attends religious service:  Not on file         Active member of club or organization:  Not on file         Attends meetings of clubs or organizations:  Not on file         Relationship status:  Not on file        ?  Intimate partner violence:              Fear of current or ex partner:  Not on file         Emotionally abused:  Not on file         Physically abused:  Not on file         Forced sexual activity:  Not on file        Other Topics  Concern        ?  Not on file       Social History Narrative        ?  Not on file           I have reviewed the nurses notes, vitals, problem list, allergy list, medical history, family, social history and medications.      Review of Symptoms:      General: Pt denies excessive weight gain or loss. Pt is able to conduct ADL's   HEENT: Denies blurred vision, headaches, epistaxis and difficulty swallowing.   Respiratory: Denies shortness of breath,+ DOE, no wheezing or stridor.   Cardiovascular: Denies precordial pain, palpitations, edema or PND   Gastrointestinal: Denies poor appetite, indigestion, abdominal pain or blood in stool   Musculoskeletal: Denies pain or swelling from muscles or joints   Neurologic: Denies tremor, paresthesias, or sensory motor disturbance   Skin: Denies rash, itching or texture change.  Physical  Exam: ??      General: Well developed, in no acute distress, cooperative and alert   HEENT: No carotid bruits, no JVD, trach is midline. Neck Supple, PEERL, EOM intact.   Heart: ??Normal S1/S2 negative S3 or S4. Regular, no murmur, gallop or rub.??   Respiratory: Clear bilaterally x 4, no wheezing or rales   Abdomen:?? ??Soft, non-tender, no masses, bowel sounds are active.??   Extremities:  No edema, normal cap refill, no cyanosis, atraumatic.    Neuro: A&Ox3, speech clear, gait stable.    Skin: Skin color is normal. No rashes or lesions. Non diaphoretic   Vascular: 2+ pulses symmetric in all extremities      Cardiographics      ECG: nsr         Cardiology Labs:     Lab Results         Component  Value  Date/Time            Cholesterol, total  165  02/02/2018 10:42 AM       HDL Cholesterol  41  02/02/2018 10:42 AM       LDL, calculated  100 (H)  02/02/2018 10:42 AM            Triglyceride  118  02/02/2018 10:42 AM             Lab Results         Component  Value  Date/Time            Sodium  142  02/02/2018 10:42 AM       Potassium  4.9  02/02/2018 10:42 AM       Chloride  104  02/02/2018 10:42 AM       CO2  21  02/02/2018 10:42 AM       Glucose  76  02/02/2018 10:42 AM       BUN  12  02/02/2018 10:42 AM       Creatinine  1.27  02/02/2018 10:42 AM       BUN/Creatinine ratio  9 (L)  02/02/2018 10:42 AM       GFR est AA  69  02/02/2018 10:42 AM       GFR est non-AA  59 (L)  02/02/2018 10:42 AM       Calcium  9.1  02/02/2018 10:42 AM       Bilirubin, total  0.4  02/02/2018 10:42 AM       AST (SGOT)  22  02/02/2018 10:42 AM       Alk. phosphatase  92  02/02/2018 10:42 AM       Protein, total  7.3  02/02/2018 10:42 AM       Albumin  4.3  02/02/2018 10:42 AM       A-G Ratio  1.4  02/02/2018 10:42 AM            ALT (SGPT)  12  02/02/2018 10:42 AM                  Assessment:       Assessment:        Diagnoses and all orders for this visit:      1. Syncope, unspecified syncope type   -     AMB POC EKG ROUTINE W/ 12 LEADS, INTER  & REP   -     DUPLEX CAROTID BILATERAL; Future   -     ECHO ADULT COMPLETE; Future   -  EXERCISE CARDIAC STRESS TEST; Future   -     LOOP MONITOR; Future      2. Essential hypertension   -     DUPLEX CAROTID BILATERAL; Future   -     ECHO ADULT COMPLETE; Future   -     EXERCISE CARDIAC STRESS TEST; Future   -     LOOP MONITOR; Future      3. DOE (dyspnea on exertion)                   ICD-10-CM  ICD-9-CM             1.  Syncope, unspecified syncope type  R55  780.2  AMB POC EKG ROUTINE W/ 12 LEADS, INTER & REP                DUPLEX CAROTID BILATERAL           ECHO ADULT COMPLETE           EXERCISE CARDIAC STRESS TEST           LOOP MONITOR           2.  Essential hypertension  I10  401.9  DUPLEX CAROTID BILATERAL                ECHO ADULT COMPLETE           EXERCISE CARDIAC STRESS TEST           LOOP MONITOR           3.  DOE (dyspnea on exertion)  R06.09  786.09            Orders Placed This Encounter        ?  LOOP MONITOR, Clinic Performed              Standing Status:    Future         Standing Expiration Date:    11/26/2018         Order Specific Question:    Reason for Exam:         Answer:    syncope        ?  AMB POC EKG ROUTINE W/ 12 LEADS, INTER & REP              Order Specific Question:    Reason for Exam:              Answer:    routine              Plan:        Patient is a 62 year old male referred to cardiology for evaluation of 2 syncopal episodes of unknown cause.  Will evaluate for structural heart defects with transthoracic echocardiogram, ischemia with routine exercise stress test.  History of long-term  tobacco abuse will evaluate for carotid stenosis with carotid duplex, 4-week event monitor applied in clinic today.  If all testing is normal no further work-up needed unless there is a recurrent syncopal episode;  then he would require ILR placement.      Garret Reddish, NP      This note was created using voice recognition software. Despite editing, there may be syntax errors.          Patient  seen and examined by me with nurse practitioner. ??I personally performed all components of the history, physical, and medical decision making and agree with the assessment and plan with minor  modifications as noted.  Angelique Holm, MD

## 2018-05-26 NOTE — Progress Notes (Signed)
Patient received a 30 day event monitor.  Instructions given verbally as well as an instruction sheet.  Pt verbalized understanding.    Biotel Event Monitoring

## 2018-06-03 ENCOUNTER — Ambulatory Visit

## 2018-06-03 ENCOUNTER — Ambulatory Visit: Admit: 2018-06-03 | Discharge: 2018-06-03 | Primary: Family Medicine

## 2018-06-03 ENCOUNTER — Encounter: Primary: Family Medicine

## 2018-06-03 DIAGNOSIS — R55 Syncope and collapse: Secondary | ICD-10-CM

## 2018-06-03 LAB — ECHO ADULT COMPLETE
AV Area by Peak Velocity: 2.1 cm2
AV Peak Gradient: 10.6 mmHg
AV Peak Velocity: 162.88 cm/s
Aortic Root: 3.07 cm
E/E' Lateral: 4.09
IVSd: 0.87 cm (ref 0.6–1.0)
LA Area 4C: 14.9 cm2
LA Major Axis: 3.04 cm
LA Volume 2C: 54.1 mL (ref 18–58)
LA Volume 4C: 35.17 mL (ref 18–58)
LA Volume BP: 46.66 mL (ref 18–58)
LA Volume Index 2C: 28.65 ml/m2 (ref 16–28)
LA Volume Index 4C: 18.62 ml/m2 (ref 16–28)
LA Volume Index BP: 24.71 ml/m2 (ref 16–28)
LA/AO Root Ratio: 0.99
LV E' Lateral Velocity: 11.86 cm/s
LV Mass 2D Index: 93.8 g/m2 (ref 49–115)
LV Mass 2D: 177.1 g (ref 88–224)
LVIDd: 5.02 cm (ref 4.2–5.9)
LVIDs: 3.04 cm
LVOT Diameter: 1.96 cm
LVOT Peak Gradient: 5.2 mmHg
LVOT Peak Velocity: 113.6 cm/s
LVOT SV: 74.4 ml
LVOT VTI: 24.69 cm
LVPWd: 0.87 cm (ref 0.6–1.0)
MV A Velocity: 58.3 cm/s
MV Area by PHT: 3.3 cm2
MV E Velocity: 48.45 cm/s
MV E Wave Deceleration Time: 228.9 ms
MV E/A: 0.83
MV PHT: 66.4 ms
TAPSE: 2.13 cm — AB (ref 1.5–2.0)

## 2018-06-03 LAB — TRANSTHORACIC ECHOCARDIOGRAM (TTE) COMPLETE (CONTRAST/BUBBLE/3D PRN)
AV Area by Peak Velocity: 2.1 cm2
AV Peak Gradient: 10.6 mmHg
AV Peak Velocity: 162.88 cm/s
Aortic Root: 3.07 cm
E/E' Lateral: 4.09
IVSd: 0.87 cm (ref 0.6–1)
LA Area 4C: 14.9 cm2
LA Major Axis: 3.04 cm
LA Volume 2C: 54.1 mL (ref 18–58)
LA Volume 4C: 35.17 mL (ref 18–58)
LA Volume BP: 46.66 mL (ref 18–58)
LA Volume Index 2C: 28.65 ml/m2 (ref 16–28)
LA Volume Index 4C: 18.62 ml/m2 (ref 16–28)
LA Volume Index BP: 24.71 ml/m2 (ref 16–28)
LA/AO Root Ratio: 0.99
LV E' Lateral Velocity: 11.86 cm/s
LV Mass 2D Index: 93.8 g/m2 (ref 49–115)
LV Mass 2D: 177.1 g (ref 88–224)
LVIDd: 5.02 cm (ref 4.2–5.9)
LVIDs: 3.04 cm
LVOT Diameter: 1.96 cm
LVOT Peak Gradient: 5.2 mmHg
LVOT Peak Velocity: 113.6 cm/s
LVOT SV: 74.4 ml
LVOT VTI: 24.69 cm
LVPWd: 0.87 cm (ref 0.6–1)
Left Ventricular Ejection Fraction: 58
MV A Velocity: 58.3 cm/s
MV Area by PHT: 3.3 cm2
MV E Velocity: 48.45 cm/s
MV E Wave Deceleration Time: 228.9 ms
MV E/A: 0.83
MV PHT: 66.4 ms
TAPSE: 2.13 cm — AB (ref 1.5–2)

## 2018-06-03 NOTE — Progress Notes (Signed)
Dawn: Please call patient echocardiogram is normal

## 2018-06-03 NOTE — Progress Notes (Signed)
Spoke with patient regarding ECHO test results.  Verified patient with two identifiers.

## 2018-06-05 ENCOUNTER — Ambulatory Visit

## 2018-06-05 ENCOUNTER — Ambulatory Visit: Admit: 2018-06-05 | Discharge: 2018-06-05 | Primary: Family Medicine

## 2018-06-05 DIAGNOSIS — R55 Syncope and collapse: Secondary | ICD-10-CM

## 2018-06-05 LAB — DUPLEX CAROTID BILATERAL
LEFT EXTERNAL CAROTID ARTERY D: 15 cm/s
LEFT VERTEBRAL ARTERY D: 12 cm/s
Left CCA dist dias: 17 cm/s
Left CCA dist sys: 74 cm/s
Left CCA prox dias: 22 cm/s
Left CCA prox sys: 103 cm/s
Left ECA sys: 70 cm/s
Left ICA dist dias: 15 cm/s
Left ICA dist sys: 41 cm/s
Left ICA mid dias: 26 cm/s
Left ICA mid sys: 59 cm/s
Left ICA prox dias: 13 cm/s
Left ICA prox sys: 35 cm/s
Left ICA/CCA sys: 0.57
Left subclavian sys: 69 cm/s
Left vertebral sys: 44 cm/s
RIGHT EXTERNAL CAROTID ARTERY D: 9 cm/s
RIGHT VERTEBRAL ARTERY D: 7 cm/s
Right CCA dist dias: 18 cm/s
Right CCA prox dias: 19 cm/s
Right CCA prox sys: 103 cm/s
Right ICA dist dias: 19 cm/s
Right ICA dist sys: 55 cm/s
Right ICA mid dias: 18 cm/s
Right ICA mid sys: 60 cm/s
Right ICA prox dias: 14 cm/s
Right ICA prox sys: 65 cm/s
Right ICA/CCA sys: 0.63
Right cca dist sys: 103 cm/s
Right eca sys: 81 cm/s
Right subclavian sys: 76 cm/s
Right vertebral sys: 45 cm/s

## 2018-06-05 LAB — VAS DUP CAROTID BILATERAL
Left CCA dist EDV: 17 cm/s
Left CCA dist PSV: 74 cm/s
Left CCA prox EDV: 22 cm/s
Left CCA prox PSV: 103 cm/s
Left ECA EDV: 15 cm/s
Left ECA PSV: 70 cm/s
Left ICA dist EDV: 15 cm/s
Left ICA dist PSV: 41 cm/s
Left ICA mid EDV: 26 cm/s
Left ICA mid PSV: 59 cm/s
Left ICA prox EDV: 13 cm/s
Left ICA prox PSV: 35 cm/s
Left ICA/CCA PSV: 0.57
Left subclavian PSV: 69 cm/s
Left vertebral EDV: 12 cm/s
Left vertebral PSV: 44 cm/s
Right CCA dist EDV: 18 cm/s
Right CCA prox EDV: 19 cm/s
Right CCA prox PSV: 103 cm/s
Right ECA EDV: 9 cm/s
Right ECA PSV: 81 cm/s
Right ICA dist EDV: 19 cm/s
Right ICA dist PSV: 55 cm/s
Right ICA mid EDV: 18 cm/s
Right ICA mid PSV: 60 cm/s
Right ICA prox EDV: 14 cm/s
Right ICA prox PSV: 65 cm/s
Right ICA/CCA PSV: 0.63
Right cca dist PSV: 103 cm/s
Right subclavian PSV: 76 cm/s
Right vertebral EDV: 7 cm/s
Right vertebral PSV: 45 cm/s

## 2018-06-05 NOTE — Progress Notes (Signed)
Shawn Jackson: Please call patient, carotid ultrasound is normal. The exercise stress test is abnormal, waiting to hear back from Dr Chaudhry about next type of stress test he wants.

## 2018-06-05 NOTE — Progress Notes (Signed)
Patient identified with 2 patient identifiers. Patient education for stress testing completed. Patient verbalized understanding.

## 2018-06-05 NOTE — Progress Notes (Signed)
Left message for patient to return my call.

## 2018-06-05 NOTE — Progress Notes (Signed)
Exercise stress abnormal,  Do you want a exercise Myoview or Stress ECHO,  We did this work up for syncope, asymptomatic of CAD

## 2018-06-05 NOTE — Progress Notes (Signed)
Dawn: Please call patient, carotid ultrasound is normal. The exercise stress test is abnormal, waiting to hear back from Dr Delbert Phenixhaudhry about next type of stress test he wants.

## 2018-06-05 NOTE — Progress Notes (Signed)
Marrian Salvage, MD    Will ask Dr. Delbert Phenix what type of test he wants to schedule      Inform pt that carotid doppler is k. No significant stenosis. He has incidentally noted possible nodule in thyroid gland on [place]. Have him f/u with Dr. Shawna Orleans.   Spoke with patient regarding CAROTID and STRESS test results.  Verified patient with two identifiers.

## 2018-06-05 NOTE — Progress Notes (Signed)
Exercise stress abnormal,  Do you want a exercise Myoview or Stress ECHO,  We did this work up for syncope, asymptomatic of CAD

## 2018-06-05 NOTE — Progress Notes (Signed)
Chaudhry, Mohammad Sohail, MD    Will ask Dr. Chaudhry what type of test he wants to schedule  ??    Inform pt that carotid doppler is k. No significant stenosis. He has incidentally noted possible nodule in thyroid gland on US. Have him f/u with Dr. Macdougall.   Spoke with patient regarding CAROTID and STRESS test results.  Verified patient with two identifiers.

## 2018-06-06 LAB — EXERCISE CARDIAC STRESS TEST
Angina Index: 0
Baseline Diastolic BP: 80 mmHg
Baseline HR: 73 {beats}/min
Baseline O2 Sat: 99 %
Baseline Systolic BP: 130 mmHg
Duke Treadmill Score: 2
Exercise Duration Time: 9:0 {titer}
Recovery Stage 1 HR: 84 {beats}/min
Stress Diastolic BP: 80 mmHg
Stress Estimated Workload: 10.1 METS
Stress O2 Sat: 97 %
Stress Peak HR: 148 {beats}/min
Stress Percent HR Achieved: 95 %
Stress Rate Pressure Product: 26640 bpm*mmHg
Stress ST Depression: 1.5 mm
Stress Stage 1 Duration: 3 min:sec
Stress Stage 1 HR: 83 {beats}/min
Stress Stage 2 Duration: 3 min:sec
Stress Stage 2 HR: 101 {beats}/min
Stress Stage 3 Duration: 3 min:sec
Stress Stage 3 HR: 148 {beats}/min
Stress Systolic BP: 180 mmHg
Stress Target HR: 156 {beats}/min

## 2018-06-06 LAB — STRESS TEST ONLY EXERCISE
Angina Index: 0
Baseline Diastolic BP: 80 mmHg
Baseline HR: 73 {beats}/min
Baseline O2 Sat: 99 %
Baseline Systolic BP: 130 mmHg
Duke Treadmill Score: 2
Exercise Duration Time: 9:0 {titer}
Recovery Stage 1 HR: 84 {beats}/min
Stress Diastolic BP: 80 mmHg
Stress Estimated Workload: 10.1 METS
Stress O2 Sat: 97 %
Stress Peak HR: 148 {beats}/min
Stress Percent HR Achieved: 95 %
Stress Rate Pressure Product: 26640 bpm*mmHg
Stress ST Depression: 1.5 mm
Stress Stage 1 Duration: 3 min:sec
Stress Stage 1 HR: 83 {beats}/min
Stress Stage 2 Duration: 3 min:sec
Stress Stage 2 HR: 101 {beats}/min
Stress Stage 3 Duration: 3 min:sec
Stress Stage 3 HR: 148 {beats}/min
Stress Systolic BP: 180 mmHg
Stress Target HR: 156 {beats}/min

## 2018-06-09 ENCOUNTER — Encounter

## 2018-06-17 ENCOUNTER — Encounter: Primary: Family Medicine

## 2018-06-17 NOTE — Telephone Encounter (Signed)
Please give pt a call, he wants to speak w/ you before r/s the stress echo

## 2018-06-17 NOTE — Telephone Encounter (Signed)
Called pt and left message to call me back.

## 2018-06-18 NOTE — Telephone Encounter (Signed)
Returned pt's call,verified pt with two pt identifiers, pt advised that someone called him and canceled the stress test he had scheduled. Answered any questions he had about testing. Advised his testing probably needs a prior auth. Advised someone will call if approved and then schedule. Pt verbalized understanding.

## 2018-06-18 NOTE — Telephone Encounter (Signed)
Patient returning call, please call again. thanks

## 2018-06-25 NOTE — Telephone Encounter (Signed)
Message   Received: Today   Message Contents   Wynn BankerKirby, Casey R  Rhoades, Dawn M, LPN   Caller: Unspecified (1 week ago)      ??      I called him and explained to him that, unfortunately, the insurance company he has tends to always "pend" authorizations for clinical review. ??So because I did not have the final approval on fill, we needed to reschedule. ??I wanted to r/s with him while I had him on the phone, but he said he couldn't at the time and but he would call us back the next day to do so. ??I told him to be sure to please call me back and r/s this. ??His insurance company [facility] can take up to 7-14 days to finalize an approval, but I wanted to go ahead and get him back on the schedule for a few days later and hope and pray that we would have the approval by then. ??I do have the authorization now, I just assumed he never called back and rescheduled like he said he was going to do. I'm sorry!     Thanks,   Baird Lyonsasey        [facility]. [facility].

## 2018-06-26 NOTE — Telephone Encounter (Addendum)
-----   Message from Marrian SalvageMohammad Sohail Chaudhry, MD sent at 06/25/2018  1:01 PM EDT -----  Inform him monitor is k        Called pt and left message to call me back.      Called pt,verified pt with two pt identifiers, advised pt his monitor is normal, okay. Pt verbalized understanding.

## 2018-06-30 NOTE — Telephone Encounter (Signed)
Message   Received: Today   Message Contents   Pasty Arch M, LPN   Caller: Unspecified (1 week ago)      ??      09/12 @ 9:00am, patient is also asking about the results from his routine stress test     Please advise     Thanks      Noted. Thanks.

## 2018-07-09 ENCOUNTER — Encounter: Primary: Family Medicine

## 2018-07-21 NOTE — Telephone Encounter (Signed)
Received call from patent requesting to be switched from Losartan to Amlodipine due to recall on Losartan. Please advise.

## 2018-07-22 NOTE — Telephone Encounter (Signed)
Voicemail left for patient requesting return call regarding medication problem.

## 2018-07-23 NOTE — Telephone Encounter (Signed)
Patient was returning a call to the nurse in regards to medication

## 2018-07-23 NOTE — Telephone Encounter (Signed)
Appointment scheduled for 07/29/18 to discuss starting alternative medication.

## 2018-07-29 ENCOUNTER — Ambulatory Visit: Attending: Family Medicine | Primary: Family Medicine

## 2018-07-29 ENCOUNTER — Ambulatory Visit: Admit: 2018-07-29 | Attending: Family Medicine | Primary: Family Medicine

## 2018-07-29 DIAGNOSIS — I10 Essential (primary) hypertension: Secondary | ICD-10-CM

## 2018-07-29 MED ORDER — OLMESARTAN 40 MG TAB
40 mg | ORAL_TABLET | Freq: Every day | ORAL | 3 refills | Status: DC
Start: 2018-07-29 — End: 2018-07-29

## 2018-07-29 MED ORDER — OLMESARTAN 40 MG TAB
40 mg | ORAL_TABLET | Freq: Every day | ORAL | 3 refills | Status: DC
Start: 2018-07-29 — End: 2018-12-09

## 2018-07-29 NOTE — Patient Instructions (Signed)
DASH Diet: Care Instructions  Your Care Instructions    The DASH diet is an eating plan that can help lower your blood pressure. DASH stands for Dietary Approaches to Stop Hypertension. Hypertension is high blood pressure.  The DASH diet focuses on eating foods that are high in calcium, potassium, and magnesium. These nutrients can lower blood pressure. The foods that are highest in these nutrients are fruits, vegetables, low-fat dairy products, nuts, seeds, and legumes. But taking calcium, potassium, and magnesium supplements instead of eating foods that are high in those nutrients does not have the same effect. The DASH diet also includes whole grains, fish, and poultry.  The DASH diet is one of several lifestyle changes your doctor may recommend to lower your high blood pressure. Your doctor may also want you to decrease the amount of sodium in your diet. Lowering sodium while following the DASH diet can lower blood pressure even further than just the DASH diet alone.  Follow-up care is a key part of your treatment and safety. Be sure to make and go to all appointments, and call your doctor if you are having problems. It's also a good idea to know your test results and keep a list of the medicines you take.  How can you care for yourself at home?  Following the DASH diet  ?? Eat 4 to 5 servings of fruit each day. A serving is 1 medium-sized piece of fruit, ?? cup chopped or canned fruit, 1/4 cup dried fruit, or 4 ounces (?? cup) of fruit juice. Choose fruit more often than fruit juice.  ?? Eat 4 to 5 servings of vegetables each day. A serving is 1 cup of lettuce or raw leafy vegetables, ?? cup of chopped or cooked vegetables, or 4 ounces (?? cup) of vegetable juice. Choose vegetables more often than vegetable juice.  ?? Get 2 to 3 servings of low-fat and fat-free dairy each day. A serving is 8 ounces of milk, 1 cup of yogurt, or 1 ?? ounces of cheese.   ?? Eat 6 to 8 servings of grains each day. A serving is 1 slice of bread, 1 ounce of dry cereal, or ?? cup of cooked rice, pasta, or cooked cereal. Try to choose whole-grain products as much as possible.  ?? Limit lean meat, poultry, and fish to 2 servings each day. A serving is 3 ounces, about the size of a deck of cards.  ?? Eat 4 to 5 servings of nuts, seeds, and legumes (cooked dried beans, lentils, and split peas) each week. A serving is 1/3 cup of nuts, 2 tablespoons of seeds, or ?? cup of cooked beans or peas.  ?? Limit fats and oils to 2 to 3 servings each day. A serving is 1 teaspoon of vegetable oil or 2 tablespoons of salad dressing.  ?? Limit sweets and added sugars to 5 servings or less a week. A serving is 1 tablespoon jelly or jam, ?? cup sorbet, or 1 cup of lemonade.  ?? Eat less than 2,300 milligrams (mg) of sodium a day. If you limit your sodium to 1,500 mg a day, you can lower your blood pressure even more.  Tips for success  ?? Start small. Do not try to make dramatic changes to your diet all at once. You might feel that you are missing out on your favorite foods and then be more likely to not follow the plan. Make small changes, and stick with them. Once those changes become habit, add a   few more changes.  ?? Try some of the following:  ? Make it a goal to eat a fruit or vegetable at every meal and at snacks. This will make it easy to get the recommended amount of fruits and vegetables each day.  ? Try yogurt topped with fruit and nuts for a snack or healthy dessert.  ? Add lettuce, tomato, cucumber, and onion to sandwiches.  ? Combine a ready-made pizza crust with low-fat mozzarella cheese and lots of vegetable toppings. Try using tomatoes, squash, spinach, broccoli, carrots, cauliflower, and onions.  ? Have a variety of cut-up vegetables with a low-fat dip as an appetizer instead of chips and dip.  ? Sprinkle sunflower seeds or chopped almonds over salads. Or try adding  chopped walnuts or almonds to cooked vegetables.  ? Try some vegetarian meals using beans and peas. Add garbanzo or kidney beans to salads. Make burritos and tacos with mashed pinto beans or black beans.  Where can you learn more?  Go to http://www.healthwise.net/[employer].  Enter H967 in the search box to learn more about "DASH Diet: Care Instructions."  Current as of: February 03, 2018  Content Version: 12.2  ?? 2006-2019 [employer]. Care instructions adapted under license by [employer] (which disclaims liability or warranty for this information). If you have questions about a medical condition or this instruction, always ask your healthcare professional. [employer]wise, [employer] disclaims any warranty or liability for your use of this information.

## 2018-07-29 NOTE — Progress Notes (Signed)
Chief Complaint   Patient presents with   ??? Hypertension     Pt who presents for follow up of a pre-existing problem of hypertension.    Diet and Lifestyle: generally follows a low fat low cholesterol diet, generally follows a low sodium diet, exercises sporadically, nonsmoker  Home BP Monitoring: is not measured at home  Use of agents associated with hypertension: none.  Cardiovascular ROS: taking medications as instructed, no medication side effects noted, no TIA's, no chest pain on exertion, no dyspnea on exertion, no swelling of ankles.     New concerns: patient would like to change medication due to recall.     Reviewed and agree with Nurse Note and duplicated in this note.  Reviewed PmHx, RxHx, FmHx, SocHx, AllgHx and updated and dated in the chart.    Family History   Problem Relation Age of Onset   ??? Cancer Mother    ??? Heart Disease Mother    ??? Hypertension Mother    ??? Asthma Sister    ??? Migraines Maternal Aunt    ??? Cancer Maternal Uncle    ??? Migraines Maternal Uncle    ??? Hypertension Maternal Uncle    ??? Cancer Maternal Grandmother    ??? Migraines Maternal Grandmother    ??? Hypertension Maternal Grandmother        Past Medical History:   Diagnosis Date   ??? Arthritis    ??? Depression    ??? Depression 04/25/2016   ??? Depression 04/25/2016   ??? Essential hypertension 04/25/2016   ??? Essential hypertension 04/25/2016   ??? Heavy smoker 04/25/2016   ??? Heavy smoker 04/25/2016   ??? Hep C w/ coma, chronic (HCC) 04/25/2016   ??? Hep C w/ coma, chronic (HCC) 04/25/2016   ??? Hypertension    ??? Liver disease       Social History     Socioeconomic History   ??? Marital status: SINGLE     Spouse name: Not on file   ??? Number of children: Not on file   ??? Years of education: Not on file   ??? Highest education level: Not on file   Tobacco Use   ??? Smoking status: Current Some Day Smoker     Packs/day: 1.00     Years: 50.00     Pack years: 50.00   ??? Smokeless tobacco: Current User   Substance and Sexual Activity   ??? Alcohol use: No   ??? Drug use: Yes      Types: Marijuana   ??? Sexual activity: Yes     Partners: Female     Birth control/protection: Condom        Review of Systems - negative except as listed above      Objective:     Vitals:    07/29/18 1420   BP: 134/84   Pulse: 62   Resp: 16   Temp: 97.3 ??F (36.3 ??C)   TempSrc: Oral   SpO2: 95%   Weight: 155 lb (70.3 kg)   Height: 5\' 10"  (1.778 m)       Physical Examination: General appearance - alert, well appearing, and in no distress  Eyes - pupils equal and reactive, extraocular eye movements intact  Ears - bilateral TM's and external ear canals normal  Nose - normal and patent, no erythema, discharge or polyps  Mouth - mucous membranes moist, pharynx normal without lesions  Neck - supple, no significant adenopathy  Chest - clear to auscultation, no wheezes, rales or  rhonchi, symmetric air entry  Heart - normal rate, regular rhythm, normal S1, S2, no murmurs, rubs, clicks or gallops  Abdomen - soft, nontender, nondistended, no masses or organomegaly  Neurological - alert, oriented, normal speech, no focal findings or movement disorder noted  Musculoskeletal - no joint tenderness, deformity or swelling  Extremities - peripheral pulses normal, no pedal edema, no clubbing or cyanosis  Skin - normal coloration and turgor, no rashes, no suspicious skin lesions noted    Assessment/ Plan:   Diagnoses and all orders for this visit:    1. Essential hypertension    Other orders  -     olmesartan (BENICAR) 40 mg tablet; Take 1 Tab by mouth daily.       Will change from losartan to olmesartan due to    Medication Side Effects and Warnings were discussed with patient,  Patient Labs were reviewed and or requested, and  Patient Past Records were reviewed and or requested  yes       I have discussed the diagnosis with the patient and the intended plan as seen in the above orders.  The patient has received an after-visit summary and questions were answered concerning future plans.       Pt agrees to call or return to clinic  and/or go to closest ER with any worsening of symptoms.  This may include, but not limited to increased fever (>100.4) with NSAIDS or Tylenol, increased edema, confusion, rash, worsening of presenting symptoms.

## 2018-07-29 NOTE — Progress Notes (Signed)
Chief Complaint   Patient presents with   ??? Hypertension     Pt who presents for follow up of a pre-existing problem of hypertension.    Diet and Lifestyle: generally follows a low fat low cholesterol diet, generally follows a low sodium diet, exercises sporadically, nonsmoker  Home BP Monitoring: is not measured at home  Use of agents associated with hypertension: none.  Cardiovascular ROS: taking medications as instructed, no medication side effects noted, no TIA's, no chest pain on exertion, no dyspnea on exertion, no swelling of ankles.     New concerns: patient would like to change medication due to recall.     Reviewed and agree with Nurse Note and duplicated in this note.  Reviewed PmHx, RxHx, FmHx, SocHx, AllgHx and updated and dated in the chart.    Family History   Problem Relation Age of Onset   ??? Cancer Mother    ??? Heart Disease Mother    ??? Hypertension Mother    ??? Asthma Sister    ??? Migraines Maternal Aunt    ??? Cancer Maternal Uncle    ??? Migraines Maternal Uncle    ??? Hypertension Maternal Uncle    ??? Cancer Maternal Grandmother    ??? Migraines Maternal Grandmother    ??? Hypertension Maternal Grandmother        Past Medical History:   Diagnosis Date   ??? Arthritis    ??? Depression    ??? Depression 04/25/2016   ??? Depression 04/25/2016   ??? Essential hypertension 04/25/2016   ??? Essential hypertension 04/25/2016   ??? Heavy smoker 04/25/2016   ??? Heavy smoker 04/25/2016   ??? Hep C w/ coma, chronic (HCC) 04/25/2016   ??? Hep C w/ coma, chronic (HCC) 04/25/2016   ??? Hypertension    ??? Liver disease       Social History     Socioeconomic History   ??? Marital status: SINGLE     Spouse name: Not on file   ??? Number of children: Not on file   ??? Years of education: Not on file   ??? Highest education level: Not on file   Tobacco Use   ??? Smoking status: Current Some Day Smoker     Packs/day: 1.00     Years: 50.00     Pack years: 50.00   ??? Smokeless tobacco: Current User   Substance and Sexual Activity   ??? Alcohol use: No   ??? Drug use: Yes      Types: Marijuana   ??? Sexual activity: Yes     Partners: Female     Birth control/protection: Condom        Review of Systems - negative except as listed above      Objective:     Vitals:    07/29/18 1420   BP: 134/84   Pulse: 62   Resp: 16   Temp: 97.3 ??F (36.3 ??C)   TempSrc: Oral   SpO2: 95%   Weight: 155 lb (70.3 kg)   Height: 5\' 10"  (1.778 m)       Physical Examination: General appearance - alert, well appearing, and in no distress  Eyes - pupils equal and reactive, extraocular eye movements intact  Ears - bilateral TM's and external ear canals normal  Nose - normal and patent, no erythema, discharge or polyps  Mouth - mucous membranes moist, pharynx normal without lesions  Neck - supple, no significant adenopathy  Chest - clear to auscultation, no wheezes, rales or  rhonchi, symmetric air entry  Heart - normal rate, regular rhythm, normal S1, S2, no murmurs, rubs, clicks or gallops  Abdomen - soft, nontender, nondistended, no masses or organomegaly  Neurological - alert, oriented, normal speech, no focal findings or movement disorder noted  Musculoskeletal - no joint tenderness, deformity or swelling  Extremities - peripheral pulses normal, no pedal edema, no clubbing or cyanosis  Skin - normal coloration and turgor, no rashes, no suspicious skin lesions noted    Assessment/ Plan:   Diagnoses and all orders for this visit:    1. Essential hypertension    Other orders  -     olmesartan (BENICAR) 40 mg tablet; Take 1 Tab by mouth daily.       Will change from losartan to olmesartan due to    Medication Side Effects and Warnings were discussed with patient,  Patient Labs were reviewed and or requested, and  Patient Past Records were reviewed and or requested  yes       I have discussed the diagnosis with the patient and the intended plan as seen in the above orders.  The patient has received an after-visit summary and questions were answered concerning future plans.        Pt agrees to call or return to clinic and/or go to closest ER with any worsening of symptoms.  This may include, but not limited to increased fever (>100.4) with NSAIDS or Tylenol, increased edema, confusion, rash, worsening of presenting symptoms.

## 2018-12-09 MED ORDER — OLMESARTAN 40 MG TAB
40 mg | ORAL_TABLET | ORAL | 2 refills | Status: DC
Start: 2018-12-09 — End: 2019-03-15

## 2019-03-08 ENCOUNTER — Telehealth: Attending: Family Medicine | Primary: Family Medicine

## 2019-03-08 ENCOUNTER — Telehealth: Admit: 2019-03-08 | Payer: PRIVATE HEALTH INSURANCE | Attending: Family Medicine | Primary: Family Medicine

## 2019-03-08 DIAGNOSIS — R55 Syncope and collapse: Secondary | ICD-10-CM

## 2019-03-08 NOTE — Progress Notes (Signed)
Please note that this dictation was completed with Dragon, the computer voice recognition software.  Quite often unanticipated grammatical, syntax, homophones, and other interpretive errors are inadvertently transcribed by the computer software.  Please disregard these errors.  Please excuse any errors that have escaped final proofreading.  Thank you.

## 2019-03-08 NOTE — Progress Notes (Signed)
Shawn Jackson is a 63 y.o. male who was seen by synchronous (real-time) audio-video technology on 03/08/2019.      Consent: Shawn Jackson, who was seen by synchronous (real-time) audio-video technology, and/or his healthcare decision maker, is aware that this patient-initiated, Telehealth encounter on 03/08/2019 is a billable service, with coverage as determined by his insurance carrier. He is aware that he may receive a bill and has provided verbal consent to proceed: Yes.      Assessment & Plan:   Diagnoses and all orders for this visit:    1. Syncope, unspecified syncope type  -     METABOLIC PANEL, COMPREHENSIVE    2. Essential hypertension  -     METABOLIC PANEL, COMPREHENSIVE    3. Elevated cholesterol  -     LIPID PANEL    If he experiences any more syncopal episodes he will to ER.  Patient will follow up with cardiology      Patient will follow-up for lab work and will do orthostatics at the time of the visit in 3 days as this is the earliest is he can come in due to transportation issues.  Complete physical in 2 weeks in office    Subjective:   Shawn Jackson is a 63 y.o. male who was seen for Hypertension  Patient states that he had a syncopal episode 2 weeks ago on a Sunday.  Patient states that he was standing by the kitchen sink and fell up against the fridge and hit his butt.  Denies any head trauma.  Patient states that he was out for about 2 seconds and denies any seizure-like activity.  This was unwitnessed.  He has had a similar episode to this about a year ago.  Denies any low blood sugar issues at the time as he just eaten 30 minutes prior.  He has been taking his blood pressure medications but not checking his blood pressures at home.  He has not followed up with cardiology since last year.  Denies any shortness of breath dyspnea on exertion or chest pain.    Diet and Lifestyle: generally follows a low fat low cholesterol diet, generally follows a low sodium diet, nonsmoker  Home BP Monitoring: is not measured  at home  Use of agents associated with hypertension: none.  Cardiovascular ROS: taking medications as instructed, no medication side effects noted, no TIA's, no chest pain on exertion, no dyspnea on exertion, no swelling of ankles.   Prior to Admission medications    Medication Sig Start Date End Date Taking? Authorizing Provider   olmesartan (BENICAR) 40 mg tablet TAKE ONE TABLET BY MOUTH DAILY 12/09/18  Yes Cassell ClementBala, Dalon Reichart K, MD     No Known Allergies    Patient Active Problem List    Diagnosis Date Noted   ??? Essential hypertension 04/25/2016   ??? Hep C w/ coma, chronic (HCC) 04/25/2016   ??? Depression 04/25/2016   ??? Heavy smoker 04/25/2016     Current Outpatient Medications   Medication Sig Dispense Refill   ??? olmesartan (BENICAR) 40 mg tablet TAKE ONE TABLET BY MOUTH DAILY 30 Tab 2     No Known Allergies  Past Medical History:   Diagnosis Date   ??? Arthritis    ??? Depression    ??? Depression 04/25/2016   ??? Depression 04/25/2016   ??? Essential hypertension 04/25/2016   ??? Essential hypertension 04/25/2016   ??? Heavy smoker 04/25/2016   ??? Heavy smoker 04/25/2016   ??? Hep C  w/ coma, chronic (HCC) 04/25/2016   ??? Hep C w/ coma, chronic (HCC) 04/25/2016   ??? Hypertension    ??? Liver disease      Past Surgical History:   Procedure Laterality Date   ??? COLONOSCOPY N/A 04/29/2017    COLONOSCOPY performed by Levonne Spiller, MD at [facility] MAIN OR   ??? HX ORTHOPAEDIC      right leg   ??? HX OTHER SURGICAL      abscess       Review of Systems   All other systems reviewed and are negative.      Objective:   Vital Signs: (As obtained by patient/caregiver at home)  There were no vitals taken for this visit.     [INSTRUCTIONS:  " " Indicates a positive item  " " Indicates a negative item  -- DELETE ALL ITEMS NOT EXAMINED]    Constitutional:  Appears well-developed and well-nourished  No apparent distress       Abnormal -     Mental status:  Alert and awake   Oriented to person/place/time  Able to follow commands     Abnormal -     Eyes:    EOM      Normal     Abnormal -   Sclera    Normal     Abnormal -          Discharge   None visible    Abnormal -     HENT:  Normocephalic, atraumatic   Abnormal -    Mouth/Throat: Mucous membranes are moist    External Ears  Normal   Abnormal -    Neck:  No visualized mass  Abnormal -     Pulmonary/Chest:  Respiratory effort normal    No visualized signs of difficulty breathing or respiratory distress         Abnormal -      Musculoskeletal:    Normal gait with no signs of ataxia          Normal range of motion of neck         Abnormal -     Neurological:         No Facial Asymmetry (Cranial nerve 7 motor function) (limited exam due to video visit)           No gaze palsy         Abnormal -          Skin:         No significant exanthematous lesions or discoloration noted on facial skin          Abnormal -            Psychiatric:        Normal Affect  Abnormal -         No Hallucinations    Other pertinent observable physical exam findings:-        We discussed the expected course, resolution and complications of the diagnosis(es) in detail.  Medication risks, benefits, costs, interactions, and alternatives were discussed as indicated.  I advised him to contact the office if his condition worsens, changes or fails to improve as anticipated. He expressed understanding with the diagnosis(es) and plan.       Shawn Jackson is a 63 y.o. male who was evaluated by a video visit encounter for concerns as above. Patient identification was verified prior to start of the visit. A caregiver was present when appropriate. Due to this being a  TeleHealth encounter (During COVID-19 public health emergency), evaluation of the following organ systems was limited: Vitals/Constitutional/EENT/Resp/CV/GI/GU/MS/Neuro/Skin/Heme-Lymph-Imm.  Pursuant to the emergency declaration under the [facility] Act and the [employer], 1135 waiver authority and the [employer] Act, this Virtual  Visit was conducted, with patient's (and/or legal guardian's) consent, to reduce the patient's risk of exposure to COVID-19 and provide necessary medical care.     Services were provided through a video synchronous discussion virtually to substitute for in-person clinic visit.   Patient and provider were located at their individual homes.      Cassell Clement, MD

## 2019-03-08 NOTE — Progress Notes (Signed)
Shawn Jackson is a 63 y.o. male who was seen by synchronous (real-time) audio-video technology on 03/08/2019.      Consent: Shawn Jackson, who was seen by synchronous (real-time) audio-video technology, and/or his healthcare decision maker, is aware that this patient-initiated, Telehealth encounter on 03/08/2019 is a billable service, with coverage as determined by his insurance carrier. He is aware that he may receive a bill and has provided verbal consent to proceed: Yes.      Assessment & Plan:   Diagnoses and all orders for this visit:    1. Syncope, unspecified syncope type  -     METABOLIC PANEL, COMPREHENSIVE    2. Essential hypertension  -     METABOLIC PANEL, COMPREHENSIVE    3. Elevated cholesterol  -     LIPID PANEL    If he experiences any more syncopal episodes he will to ER.  Patient will follow up with cardiology      Patient will follow-up for lab work and will do orthostatics at the time of the visit in 3 days as this is the earliest is he can come in due to transportation issues.  Complete physical in 2 weeks in office    Subjective:   Shawn Jackson is a 63 y.o. male who was seen for Hypertension  Patient states that he had a syncopal episode 2 weeks ago on a Sunday.  Patient states that he was standing by the kitchen sink and fell up against the fridge and hit his butt.  Denies any head trauma.  Patient states that he was out for about 2 seconds and denies any seizure-like activity.  This was unwitnessed.  He has had a similar episode to this about a year ago.  Denies any low blood sugar issues at the time as he just eaten 30 minutes prior.  He has been taking his blood pressure medications but not checking his blood pressures at home.  He has not followed up with cardiology since last year.  Denies any shortness of breath dyspnea on exertion or chest pain.    Diet and Lifestyle: generally follows a low fat low cholesterol diet, generally follows a low sodium diet, nonsmoker   Home BP Monitoring: is not measured at home  Use of agents associated with hypertension: none.  Cardiovascular ROS: taking medications as instructed, no medication side effects noted, no TIA's, no chest pain on exertion, no dyspnea on exertion, no swelling of ankles.   Prior to Admission medications    Medication Sig Start Date End Date Taking? Authorizing Provider   olmesartan (BENICAR) 40 mg tablet TAKE ONE TABLET BY MOUTH DAILY 12/09/18  Yes Cassell Clement, MD     No Known Allergies    Patient Active Problem List    Diagnosis Date Noted   ??? Essential hypertension 04/25/2016   ??? Hep C w/ coma, chronic (HCC) 04/25/2016   ??? Depression 04/25/2016   ??? Heavy smoker 04/25/2016     Current Outpatient Medications   Medication Sig Dispense Refill   ??? olmesartan (BENICAR) 40 mg tablet TAKE ONE TABLET BY MOUTH DAILY 30 Tab 2     No Known Allergies  Past Medical History:   Diagnosis Date   ??? Arthritis    ??? Depression    ??? Depression 04/25/2016   ??? Depression 04/25/2016   ??? Essential hypertension 04/25/2016   ??? Essential hypertension 04/25/2016   ??? Heavy smoker 04/25/2016   ??? Heavy smoker 04/25/2016   ??? Hep C  w/ coma, chronic (HCC) 04/25/2016   ??? Hep C w/ coma, chronic (HCC) 04/25/2016   ??? Hypertension    ??? Liver disease      Past Surgical History:   Procedure Laterality Date   ??? COLONOSCOPY N/A 04/29/2017    [facility]   ??? HX ORTHOPAEDIC      right leg   ??? HX OTHER SURGICAL      abscess       Review of Systems   All other systems reviewed and are negative.      Objective:   Vital Signs: (As obtained by patient/caregiver at home)  There were no vitals taken for this visit.     [INSTRUCTIONS:  "[x] " Indicates a positive item  "[] " Indicates a negative item  -- DELETE ALL ITEMS NOT EXAMINED]    Constitutional: [x]  Appears well-developed and well-nourished [x]  No apparent distress      []  Abnormal -     Mental status: [x]  Alert and awake  [x]  Oriented to person/place/time [x]   Able to follow commands    []  Abnormal -     Eyes:   EOM    [x]   Normal    []  Abnormal -   Sclera  [x]   Normal    []  Abnormal -          Discharge [x]   None visible   []  Abnormal -     HENT: [x]  Normocephalic, atraumatic  []  Abnormal -   [x]  Mouth/Throat: Mucous membranes are moist    External Ears [x]  Normal  []  Abnormal -    Neck: [x]  No visualized mass []  Abnormal -     Pulmonary/Chest: [x]  Respiratory effort normal   [x]  No visualized signs of difficulty breathing or respiratory distress        []  Abnormal -      Musculoskeletal:   [x]  Normal gait with no signs of ataxia         [x]  Normal range of motion of neck        []  Abnormal -     Neurological:        [x]  No Facial Asymmetry (Cranial nerve 7 motor function) (limited exam due to video visit)          [x]  No gaze palsy        []  Abnormal -          Skin:        [x]  No significant exanthematous lesions or discoloration noted on facial skin         []  Abnormal -            Psychiatric:       [x]  Normal Affect []  Abnormal -        [x]  No Hallucinations    Other pertinent observable physical exam findings:-        We discussed the expected course, resolution and complications of the diagnosis(es) in detail.  Medication risks, benefits, costs, interactions, and alternatives were discussed as indicated.  I advised him to contact the office if his condition worsens, changes or fails to improve as anticipated. He expressed understanding with the diagnosis(es) and plan.       Shawn Jackson is a 63 y.o. male who was evaluated by a video visit encounter for concerns as above. Patient identification was verified prior to start of the visit. A caregiver was present when appropriate. Due to this being a  TeleHealth encounter (During COVID-19 public health emergency), evaluation of the following organ systems was limited: Vitals/Constitutional/EENT/Resp/CV/GI/GU/MS/Neuro/Skin/Heme-Lymph-Imm.  Pursuant to the emergency declaration under the [facility] Act and the  [employer], 1135 waiver authority and the [employer] Act, this Virtual  Visit was conducted, with patient's (and/or legal guardian's) consent, to reduce the patient's risk of exposure to COVID-19 and provide necessary medical care.     Services were provided through a video synchronous discussion virtually to substitute for in-person clinic visit.   Patient and provider were located at their individual homes.      Cassell Clement, MD

## 2019-03-12 ENCOUNTER — Institutional Professional Consult (permissible substitution): Admit: 2019-03-12 | Discharge: 2019-03-12 | Payer: PRIVATE HEALTH INSURANCE | Primary: Family Medicine

## 2019-03-12 DIAGNOSIS — I10 Essential (primary) hypertension: Secondary | ICD-10-CM

## 2019-03-13 LAB — METABOLIC PANEL, COMPREHENSIVE
A-G Ratio: 1.6 (ref 1.2–2.2)
ALT (SGPT): 13 IU/L (ref 0–44)
AST (SGOT): 17 IU/L (ref 0–40)
Albumin: 4.4 g/dL (ref 3.8–4.8)
Alk. phosphatase: 86 IU/L (ref 39–117)
BUN/Creatinine ratio: 9 — ABNORMAL LOW (ref 10–24)
BUN: 12 mg/dL (ref 8–27)
Bilirubin, total: 0.4 mg/dL (ref 0.0–1.2)
CO2: 20 mmol/L (ref 20–29)
Calcium: 9.1 mg/dL (ref 8.6–10.2)
Chloride: 98 mmol/L (ref 96–106)
Creatinine: 1.34 mg/dL — ABNORMAL HIGH (ref 0.76–1.27)
GFR est AA: 64 mL/min/{1.73_m2} (ref >59)
GFR est non-AA: 55 mL/min/{1.73_m2} — ABNORMAL LOW (ref >59)
GLOBULIN, TOTAL: 2.8 g/dL (ref 1.5–4.5)
Glucose: 157 mg/dL — ABNORMAL HIGH (ref 65–99)
Potassium: 3.8 mmol/L (ref 3.5–5.2)
Protein, total: 7.2 g/dL (ref 6.0–8.5)
Sodium: 135 mmol/L (ref 134–144)

## 2019-03-13 LAB — LIPID PANEL
Cholesterol, Total: 158 mg/dL (ref 100–199)
Cholesterol, total: 158 mg/dL (ref 100–199)
HDL Cholesterol: 44 mg/dL (ref >39)
HDL: 44 mg/dL (ref >39)
LDL Calculated: 88 mg/dL (ref 0–99)
LDL, calculated: 88 mg/dL (ref 0–99)
Triglyceride: 128 mg/dL (ref 0–149)
Triglycerides: 128 mg/dL (ref 0–149)
VLDL Cholesterol Calculated: 26 mg/dL (ref 5–40)
VLDL, calculated: 26 mg/dL (ref 5–40)

## 2019-03-13 LAB — COMPREHENSIVE METABOLIC PANEL
ALT: 13 IU/L (ref 0–44)
AST: 17 IU/L (ref 0–40)
Albumin/Globulin Ratio: 1.6 NA (ref 1.2–2.2)
Albumin: 4.4 g/dL (ref 3.8–4.8)
Alkaline Phosphatase: 86 IU/L (ref 39–117)
BUN: 12 mg/dL (ref 8–27)
Bun/Cre Ratio: 9 NA — ABNORMAL LOW (ref 10–24)
CO2: 20 mmol/L (ref 20–29)
Calcium: 9.1 mg/dL (ref 8.6–10.2)
Chloride: 98 mmol/L (ref 96–106)
Creatinine: 1.34 mg/dL — ABNORMAL HIGH (ref 0.76–1.27)
EGFR IF NonAfrican American: 55 mL/min/{1.73_m2} — ABNORMAL LOW (ref >59)
GFR African American: 64 mL/min/{1.73_m2} (ref >59)
Globulin, Total: 2.8 g/dL (ref 1.5–4.5)
Glucose: 157 mg/dL — ABNORMAL HIGH (ref 65–99)
Potassium: 3.8 mmol/L (ref 3.5–5.2)
Sodium: 135 mmol/L (ref 134–144)
Total Bilirubin: 0.4 mg/dL (ref 0.0–1.2)
Total Protein: 7.2 g/dL (ref 6.0–8.5)

## 2019-03-15 ENCOUNTER — Encounter

## 2019-03-15 MED ORDER — OLMESARTAN 40 MG TAB
40 mg | ORAL_TABLET | ORAL | 1 refills | Status: DC
Start: 2019-03-15 — End: 2019-09-07

## 2019-03-23 ENCOUNTER — Encounter: Attending: Family Medicine | Primary: Family Medicine

## 2019-09-07 MED ORDER — OLMESARTAN 40 MG TAB
40 mg | ORAL_TABLET | ORAL | 0 refills | Status: DC
Start: 2019-09-07 — End: 2019-12-13

## 2019-09-13 NOTE — Telephone Encounter (Signed)
Sidney with [facility] called to speak with Dr. Warren Lacy about the patient's accommodation form. The facility is asking, would it be okay if the resident could have an air condition placed in the home versus moving to a new unit. Please review form and contact Sidney at [phone]. Thanks!

## 2019-10-25 NOTE — Telephone Encounter (Signed)
Ms. Shawn Jackson is asking that you call her at [phone] regarding form sent back to them. Some conflicting answers.

## 2019-11-01 ENCOUNTER — Ambulatory Visit: Attending: Family Medicine | Primary: Family Medicine

## 2019-11-01 ENCOUNTER — Ambulatory Visit: Admit: 2019-11-01 | Discharge: 2019-11-01 | Payer: MEDICARE | Attending: Family Medicine | Primary: Family Medicine

## 2019-11-01 DIAGNOSIS — Z0000 Encounter for general adult medical examination without abnormal findings: Secondary | ICD-10-CM

## 2019-11-01 NOTE — Addendum Note (Signed)
Addended by: Margette Fast on: 11/01/2019 12:25 PM     Modules accepted: Orders

## 2019-11-01 NOTE — Progress Notes (Signed)
Chief Complaint   Patient presents with   ??? Physical     he is a 64 y.o. year old male who presents for CPE.  Complete Physical Exam Questions:    1.  Do you follow a low fat diet?  no  2.  Are you up to date on your Tdap (<10 years)?  Unknown  3.  Have you ever had a Pneumovax vaccine (>65)?  No   PCV13 No   PPSV23 No  4.  Have you had Zoster vaccine (>60)? No  5.  Have you had the HPV - Gardasil (13- 26)? No  6.  Do you follow an exercise program?  no  7.  Do you smoke?  yes If > 65 and smoker, have you had a abdominal aortic aneurysm ultrasound screen?  No  8.  Do you consider yourself overweight?  no  9.  Is there a family history of CAD< age 67?  No  10.  Is there a family history of Cancer?  No  11.  Do you know your Cancer risks? No  12.  Have you had a colonoscopy?  Yes  13. Have you been tested for HIV or other STI's? No HIV today(18-65 y/o)?No   14.  Have you had an EKG in the last five years(>50)?Yes  15.  Have you had a PSA test done this year (50-69)? No    Other complaints: none    Reviewed and agree with Nurse Note and duplicated in this note.  Reviewed PmHx, RxHx, FmHx, SocHx, AllgHx and updated and dated in the chart.    Family History   Problem Relation Age of Onset   ??? Cancer Mother    ??? Heart Disease Mother    ??? Hypertension Mother    ??? Asthma Sister    ??? Migraines Maternal Aunt    ??? Cancer Maternal Uncle    ??? Migraines Maternal Uncle    ??? Hypertension Maternal Uncle    ??? Cancer Maternal Grandmother    ??? Migraines Maternal Grandmother    ??? Hypertension Maternal Grandmother        Past Medical History:   Diagnosis Date   ??? Arthritis    ??? Depression    ??? Depression 04/25/2016   ??? Depression 04/25/2016   ??? Essential hypertension 04/25/2016   ??? Essential hypertension 04/25/2016   ??? Heavy smoker 04/25/2016   ??? Heavy smoker 04/25/2016   ??? Hep C w/ coma, chronic (HCC) 04/25/2016   ??? Hep C w/ coma, chronic (HCC) 04/25/2016   ??? Hypertension    ??? Liver disease       Social History     Socioeconomic History    ??? Marital status: SINGLE     Spouse name: Not on file   ??? Number of children: Not on file   ??? Years of education: Not on file   ??? Highest education level: Not on file   Tobacco Use   ??? Smoking status: Current Some Day Smoker     Packs/day: 1.00     Years: 50.00     Pack years: 50.00   ??? Smokeless tobacco: Never Used   Substance and Sexual Activity   ??? Alcohol use: No   ??? Drug use: Yes     Types: Marijuana   ??? Sexual activity: Yes     Partners: Female     Birth control/protection: Condom        Review of Systems - negative except as listed  above      Objective:     Vitals:    11/01/19 1140   BP: 97/66   Pulse: 88   Resp: 14   Temp: 98.9 ??F (37.2 ??C)   TempSrc: Oral   SpO2: 95%   Weight: 152 lb 4.8 oz (69.1 kg)   Height: 5\' 10"  (1.778 m)       Physical Examination: General appearance - alert, well appearing, and in no distress  Eyes - pupils equal and reactive, extraocular eye movements intact  Ears - bilateral TM's and external ear canals normal  Nose - normal and patent, no erythema, discharge or polyps  Mouth - mucous membranes moist, pharynx normal without lesions  Neck - supple, no significant adenopathy  Chest - clear to auscultation, no wheezes, rales or rhonchi, symmetric air entry  Heart - normal rate, regular rhythm, normal S1, S2, no murmurs, rubs, clicks or gallops  Abdomen - soft, nontender, nondistended, no masses or organomegaly  Back exam - full range of motion, no tenderness, palpable spasm or pain on motion  Neurological - alert, oriented, normal speech, no focal findings or movement disorder noted  Musculoskeletal - no joint tenderness, deformity or swelling  Extremities - peripheral pulses normal, no pedal edema, no clubbing or cyanosis  Skin - normal coloration and turgor, no rashes, no suspicious skin lesions noted       Assessment/ Plan:   Diagnoses and all orders for this visit:    1. Visit for well man health check  -     CBC W/O DIFF; Future  -     LIPID PANEL; Future   -     METABOLIC PANEL, COMPREHENSIVE; Future  -     PSA W/ REFLX FREE PSA; Future           Labs to be drawn: CBC, CMP, Lipid      I have discussed the diagnosis with the patient and the intended plan as seen in the above orders.  The patient has received an after-visit summary and questions were answered concerning future plans.       Medication Side Effects and Warnings were discussed with patient,  Patient Labs were reviewed and or requested, and  Patient Past Records were reviewed and or requested  yes         Pt agrees to call or return to clinic and/or go to closest ER with any worsening of symptoms.  This may include, but not limited to increased fever (>100.4) with NSAIDS or Tylenol, increased edema, confusion, rash, worsening of presenting symptoms.    Please note that this dictation was completed with Dragon, the computer voice recognition software.  Quite often unanticipated grammatical, syntax, homophones, and other interpretive errors are inadvertently transcribed by the computer software.  Please disregard these errors.  Please excuse any errors that have escaped final proofreading.  Thank you.

## 2019-11-01 NOTE — Addendum Note (Signed)
Addendum Note  by Margette Fast at 11/01/19 1130                Author: Margette Fast  Service: --  Author Type: Technician       Filed: 11/01/19 1225  Encounter Date: 11/01/2019  Status: Signed          Editor: Margette Fast (Technician)          Addended by: Margette Fast on: 11/01/2019 12:25 PM    Modules accepted: Orders

## 2019-11-01 NOTE — Progress Notes (Signed)
Chief Complaint   Patient presents with   ??? Physical     he is a 64 y.o. year old male who presents for CPE.  Complete Physical Exam Questions:    1.  Do you follow a low fat diet?  no  2.  Are you up to date on your Tdap (<10 years)?  Unknown  3.  Have you ever had a Pneumovax vaccine (>65)?  No   PCV13 No   PPSV23 No  4.  Have you had Zoster vaccine (>60)? No  5.  Have you had the HPV - Gardasil (13- 26)? No  6.  Do you follow an exercise program?  no  7.  Do you smoke?  yes If > 65 and smoker, have you had a abdominal aortic aneurysm ultrasound screen?  No  8.  Do you consider yourself overweight?  no  9.  Is there a family history of CAD< age 39?  No  10.  Is there a family history of Cancer?  No  11.  Do you know your Cancer risks? No  12.  Have you had a colonoscopy?  Yes  13. Have you been tested for HIV or other STI's? No HIV today(18-65 y/o)?No   14.  Have you had an EKG in the last five years(>50)?Yes  15.  Have you had a PSA test done this year (50-69)? No    Other complaints: none    Reviewed and agree with Nurse Note and duplicated in this note.  Reviewed PmHx, RxHx, FmHx, SocHx, AllgHx and updated and dated in the chart.    Family History   Problem Relation Age of Onset   ??? Cancer Mother    ??? Heart Disease Mother    ??? Hypertension Mother    ??? Asthma Sister    ??? Migraines Maternal Aunt    ??? Cancer Maternal Uncle    ??? Migraines Maternal Uncle    ??? Hypertension Maternal Uncle    ??? Cancer Maternal Grandmother    ??? Migraines Maternal Grandmother    ??? Hypertension Maternal Grandmother        Past Medical History:   Diagnosis Date   ??? Arthritis    ??? Depression    ??? Depression 04/25/2016   ??? Depression 04/25/2016   ??? Essential hypertension 04/25/2016   ??? Essential hypertension 04/25/2016   ??? Heavy smoker 04/25/2016   ??? Heavy smoker 04/25/2016   ??? Hep C w/ coma, chronic (HCC) 04/25/2016   ??? Hep C w/ coma, chronic (HCC) 04/25/2016   ??? Hypertension    ??? Liver disease       Social History     Socioeconomic History   ???  Marital status: SINGLE     Spouse name: Not on file   ??? Number of children: Not on file   ??? Years of education: Not on file   ??? Highest education level: Not on file   Tobacco Use   ??? Smoking status: Current Some Day Smoker     Packs/day: 1.00     Years: 50.00     Pack years: 50.00   ??? Smokeless tobacco: Never Used   Substance and Sexual Activity   ??? Alcohol use: No   ??? Drug use: Yes     Types: Marijuana   ??? Sexual activity: Yes     Partners: Female     Birth control/protection: Condom        Review of Systems - negative except as listed  above      Objective:     Vitals:    11/01/19 1140   BP: 97/66   Pulse: 88   Resp: 14   Temp: 98.9 ??F (37.2 ??C)   TempSrc: Oral   SpO2: 95%   Weight: 152 lb 4.8 oz (69.1 kg)   Height: 5\' 10"  (1.778 m)       Physical Examination: General appearance - alert, well appearing, and in no distress  Eyes - pupils equal and reactive, extraocular eye movements intact  Ears - bilateral TM's and external ear canals normal  Nose - normal and patent, no erythema, discharge or polyps  Mouth - mucous membranes moist, pharynx normal without lesions  Neck - supple, no significant adenopathy  Chest - clear to auscultation, no wheezes, rales or rhonchi, symmetric air entry  Heart - normal rate, regular rhythm, normal S1, S2, no murmurs, rubs, clicks or gallops  Abdomen - soft, nontender, nondistended, no masses or organomegaly  Back exam - full range of motion, no tenderness, palpable spasm or pain on motion  Neurological - alert, oriented, normal speech, no focal findings or movement disorder noted  Musculoskeletal - no joint tenderness, deformity or swelling  Extremities - peripheral pulses normal, no pedal edema, no clubbing or cyanosis  Skin - normal coloration and turgor, no rashes, no suspicious skin lesions noted       Assessment/ Plan:   Diagnoses and all orders for this visit:    1. Visit for well man health check  -     CBC W/O DIFF; Future  -     LIPID PANEL; Future  -     METABOLIC PANEL,  COMPREHENSIVE; Future  -     PSA W/ REFLX FREE PSA; Future           Labs to be drawn: CBC, CMP, Lipid      I have discussed the diagnosis with the patient and the intended plan as seen in the above orders.  The patient has received an after-visit summary and questions were answered concerning future plans.       Medication Side Effects and Warnings were discussed with patient,  Patient Labs were reviewed and or requested, and  Patient Past Records were reviewed and or requested  yes         Pt agrees to call or return to clinic and/or go to closest ER with any worsening of symptoms.  This may include, but not limited to increased fever (>100.4) with NSAIDS or Tylenol, increased edema, confusion, rash, worsening of presenting symptoms.    Please note that this dictation was completed with Dragon, the computer voice recognition software.  Quite often unanticipated grammatical, syntax, homophones, and other interpretive errors are inadvertently transcribed by the computer software.  Please disregard these errors.  Please excuse any errors that have escaped final proofreading.  Thank you.

## 2019-11-02 ENCOUNTER — Encounter

## 2019-11-04 LAB — METABOLIC PANEL, COMPREHENSIVE
A-G Ratio: 1.1 (ref 1.1–2.2)
ALT (SGPT): 22 U/L (ref 12–78)
AST (SGOT): 22 U/L (ref 15–37)
Albumin: 3.8 g/dL (ref 3.5–5.0)
Alk. phosphatase: 96 U/L (ref 45–117)
Anion gap: 4 mmol/L — ABNORMAL LOW (ref 5–15)
BUN/Creatinine ratio: 10 — ABNORMAL LOW (ref 12–20)
BUN: 14 MG/DL (ref 6–20)
Bilirubin, total: 0.2 MG/DL (ref 0.2–1.0)
CO2: 31 mmol/L (ref 21–32)
Calcium: 9.4 MG/DL (ref 8.5–10.1)
Chloride: 105 mmol/L (ref 97–108)
Creatinine: 1.35 MG/DL — ABNORMAL HIGH (ref 0.70–1.30)
GFR est AA: >60 mL/min/{1.73_m2} (ref >60)
GFR est non-AA: 53 mL/min/{1.73_m2} — ABNORMAL LOW (ref >60)
Globulin: 3.4 g/dL (ref 2.0–4.0)
Glucose: 85 mg/dL (ref 65–100)
Potassium: 4.7 mmol/L (ref 3.5–5.1)
Protein, total: 7.2 g/dL (ref 6.4–8.2)
Sodium: 140 mmol/L (ref 136–145)

## 2019-11-04 LAB — CBC W/O DIFF
ABSOLUTE NRBC: 0 10*3/uL (ref 0.00–0.01)
HCT: 50 % (ref 36.6–50.3)
HGB: 15.8 g/dL (ref 12.1–17.0)
MCH: 30.3 PG (ref 26.0–34.0)
MCHC: 31.6 g/dL (ref 30.0–36.5)
MCV: 96 FL (ref 80.0–99.0)
MPV: 11.6 FL (ref 8.9–12.9)
NRBC: 0 PER 100 WBC
PLATELET: 148 10*3/uL — ABNORMAL LOW (ref 150–400)
RBC: 5.21 M/uL (ref 4.10–5.70)
RDW: 13.9 % (ref 11.5–14.5)
WBC: 7 10*3/uL (ref 4.1–11.1)

## 2019-11-04 LAB — LIPID PANEL
CHOL/HDL Ratio: 3.5 (ref 0.0–5.0)
Chol/HDL Ratio: 3.5 (ref 0.0–5.0)
Cholesterol, Total: 164 MG/DL (ref <200)
Cholesterol, total: 164 MG/DL (ref <200)
HDL Cholesterol: 47 MG/DL
HDL: 47 MG/DL
LDL Calculated: 74.8 MG/DL (ref 0–100)
LDL, calculated: 74.8 MG/DL (ref 0–100)
Triglyceride: 211 MG/DL — ABNORMAL HIGH (ref <150)
Triglycerides: 211 MG/DL — ABNORMAL HIGH (ref <150)
VLDL Cholesterol Calculated: 42.2 MG/DL
VLDL, calculated: 42.2 MG/DL

## 2019-11-04 LAB — PSA W/ REFLX FREE PSA
PSA: 0.4 ng/mL (ref 0.0–4.0)
Prostate Specific Ag: 0.4 ng/mL (ref 0.0–4.0)

## 2019-11-04 LAB — CBC
Hematocrit: 50 % (ref 36.6–50.3)
Hemoglobin: 15.8 g/dL (ref 12.1–17.0)
MCH: 30.3 PG (ref 26.0–34.0)
MCHC: 31.6 g/dL (ref 30.0–36.5)
MCV: 96 FL (ref 80.0–99.0)
MPV: 11.6 FL (ref 8.9–12.9)
NRBC Absolute: 0 10*3/uL (ref 0.00–0.01)
Nucleated RBCs: 0 PER 100 WBC
Platelets: 148 10*3/uL — ABNORMAL LOW (ref 150–400)
RBC: 5.21 M/uL (ref 4.10–5.70)
RDW: 13.9 % (ref 11.5–14.5)
WBC: 7 10*3/uL (ref 4.1–11.1)

## 2019-11-04 LAB — COMPREHENSIVE METABOLIC PANEL
ALT: 22 U/L (ref 12–78)
AST: 22 U/L (ref 15–37)
Albumin/Globulin Ratio: 1.1 (ref 1.1–2.2)
Albumin: 3.8 g/dL (ref 3.5–5.0)
Alkaline Phosphatase: 96 U/L (ref 45–117)
Anion Gap: 4 mmol/L — ABNORMAL LOW (ref 5–15)
BUN: 14 MG/DL (ref 6–20)
Bun/Cre Ratio: 10 — ABNORMAL LOW (ref 12–20)
CO2: 31 mmol/L (ref 21–32)
Calcium: 9.4 MG/DL (ref 8.5–10.1)
Chloride: 105 mmol/L (ref 97–108)
Creatinine: 1.35 MG/DL — ABNORMAL HIGH (ref 0.70–1.30)
EGFR IF NonAfrican American: 53 mL/min/{1.73_m2} — ABNORMAL LOW (ref >60)
GFR African American: >60 mL/min/{1.73_m2} (ref >60)
Globulin: 3.4 g/dL (ref 2.0–4.0)
Glucose: 85 mg/dL (ref 65–100)
Potassium: 4.7 mmol/L (ref 3.5–5.1)
Sodium: 140 mmol/L (ref 136–145)
Total Bilirubin: 0.2 MG/DL (ref 0.2–1.0)
Total Protein: 7.2 g/dL (ref 6.4–8.2)

## 2019-12-13 MED ORDER — OLMESARTAN 40 MG TAB
40 mg | ORAL_TABLET | ORAL | 0 refills | Status: DC
Start: 2019-12-13 — End: 2020-03-13

## 2020-01-05 ENCOUNTER — Encounter

## 2020-01-26 ENCOUNTER — Inpatient Hospital Stay: Payer: MEDICARE | Attending: Nephrology | Primary: Family Medicine

## 2020-02-03 ENCOUNTER — Inpatient Hospital Stay: Admit: 2020-02-03 | Payer: MEDICARE | Attending: Nephrology | Primary: Family Medicine

## 2020-02-03 DIAGNOSIS — N179 Acute kidney failure, unspecified: Secondary | ICD-10-CM

## 2020-03-13 MED ORDER — OLMESARTAN 40 MG TAB
40 mg | ORAL_TABLET | ORAL | 0 refills | Status: DC
Start: 2020-03-13 — End: 2020-06-13

## 2020-06-13 MED ORDER — OLMESARTAN 40 MG TAB
40 mg | ORAL_TABLET | ORAL | 0 refills | Status: DC
Start: 2020-06-13 — End: 2020-09-25

## 2020-06-26 ENCOUNTER — Ambulatory Visit: Attending: Family Medicine | Primary: Family Medicine

## 2020-06-26 ENCOUNTER — Ambulatory Visit: Admit: 2020-06-26 | Discharge: 2020-06-26 | Payer: MEDICARE | Attending: Family Medicine | Primary: Family Medicine

## 2020-06-26 DIAGNOSIS — Z0000 Encounter for general adult medical examination without abnormal findings: Secondary | ICD-10-CM

## 2020-06-26 LAB — LIPID PANEL
CHOL/HDL Ratio: 3.2 (ref 0.0–5.0)
Chol/HDL Ratio: 3.2 (ref 0.0–5.0)
Cholesterol, Total: 156 MG/DL (ref <200)
Cholesterol, total: 156 MG/DL (ref <200)
HDL Cholesterol: 49 MG/DL
HDL: 49 MG/DL
LDL Calculated: 75.4 MG/DL (ref 0–100)
LDL, calculated: 75.4 MG/DL (ref 0–100)
Triglyceride: 158 MG/DL — ABNORMAL HIGH (ref <150)
Triglycerides: 158 MG/DL — ABNORMAL HIGH (ref <150)
VLDL Cholesterol Calculated: 31.6 MG/DL
VLDL, calculated: 31.6 MG/DL

## 2020-06-26 LAB — PSA SCREENING (SCREENING): Prostate Specific Ag: 0.4 ng/mL (ref 0.01–4.0)

## 2020-06-26 LAB — PSA SCREENING: Pros. Spec. Antigen: 0.4 ng/mL (ref 0.01–4.0)

## 2020-06-26 NOTE — Progress Notes (Signed)
Chief Complaint   Patient presents with   ??? Hypertension     Patient is here for a blood pressure follow up      Pt who presents for follow up of a pre-existing problem of hypertension.    Diet and Lifestyle: generally follows a low fat low cholesterol diet  Home BP Monitoring: is not measured at home  Use of agents associated with hypertension: none.  Cardiovascular ROS: taking medications as instructed, no medication side effects noted, no TIA's, no chest pain on exertion, no dyspnea on exertion, no swelling of ankles.     New concerns: none.     Reviewed and agree with Nurse Note and duplicated in this note.  Reviewed PmHx, RxHx, FmHx, SocHx, AllgHx and updated and dated in the chart.    Family History   Problem Relation Age of Onset   ??? Cancer Mother    ??? Heart Disease Mother    ??? Hypertension Mother    ??? Asthma Sister    ??? Migraines Maternal Aunt    ??? Cancer Maternal Uncle    ??? Migraines Maternal Uncle    ??? Hypertension Maternal Uncle    ??? Cancer Maternal Grandmother    ??? Migraines Maternal Grandmother    ??? Hypertension Maternal Grandmother        Past Medical History:   Diagnosis Date   ??? Arthritis    ??? Depression    ??? Depression 04/25/2016   ??? Depression 04/25/2016   ??? Essential hypertension 04/25/2016   ??? Essential hypertension 04/25/2016   ??? Heavy smoker 04/25/2016   ??? Heavy smoker 04/25/2016   ??? Hep C w/ coma, chronic (HCC) 04/25/2016   ??? Hep C w/ coma, chronic (HCC) 04/25/2016   ??? Hypertension    ??? Liver disease       Social History     Socioeconomic History   ??? Marital status: SINGLE     Spouse name: Not on file   ??? Number of children: Not on file   ??? Years of education: Not on file   ??? Highest education level: Not on file   Tobacco Use   ??? Smoking status: Current Some Day Smoker     Packs/day: 1.00     Years: 50.00     Pack years: 50.00   ??? Smokeless tobacco: Never Used   Substance and Sexual Activity   ??? Alcohol use: No   ??? Drug use: Yes     Types: Marijuana   ??? Sexual activity: Yes     Partners: Female      Birth control/protection: Condom     Social Determinants of Psychologist, prison and probation services Strain:    ??? Difficulty of Paying Living Expenses:    Food Insecurity:    ??? Worried About Programme researcher, broadcasting/film/video in the Last Year:    ??? Barista in the Last Year:    Transportation Needs:    ??? Freight forwarder (Medical):    ??? Lack of Transportation (Non-Medical):    Physical Activity:    ??? Days of Exercise per Week:    ??? Minutes of Exercise per Session:    Stress:    ??? Feeling of Stress :    Social Connections:    ??? Frequency of Communication with Friends and Family:    ??? Frequency of Social Gatherings with Friends and Family:    ??? Attends Religious Services:    ??? Active Member of  Clubs or Organizations:    ??? Attends Engineer, structural:    ??? Marital Status:         Review of Systems - negative except as listed above      Objective:     Vitals:    06/26/20 1024   Resp: 16   Weight: 141 lb (64 kg)   Height: 5\' 10"  (1.778 m)       Physical Examination: General appearance - alert, well appearing, and in no distress  Eyes - pupils equal and reactive, extraocular eye movements intact  Ears - bilateral TM's and external ear canals normal  Nose - normal and patent, no erythema, discharge or polyps  Mouth - mucous membranes moist, pharynx normal without lesions  Neck - supple, no significant adenopathy  Chest - clear to auscultation, no wheezes, rales or rhonchi, symmetric air entry  Heart - normal rate, regular rhythm, normal S1, S2, no murmurs, rubs, clicks or gallops  Abdomen - soft, nontender, nondistended, no masses or organomegaly  Neurological - alert, oriented, normal speech, no focal findings or movement disorder noted  Musculoskeletal - no joint tenderness, deformity or swelling  Extremities - peripheral pulses normal, no pedal edema, no clubbing or cyanosis  Skin - normal coloration and turgor, no rashes, no suspicious skin lesions noted     Assessment/ Plan:   Diagnoses and all orders for this  visit:    1. Medicare annual wellness visit, subsequent    2. Screening for depression  -     DEPRESSION SCREEN ANNUAL    3. Essential hypertension  Continue current meds  4. Screening for ischemic heart disease  -     LIPID PANEL; Future    5. Special screening for malignant neoplasm of prostate  -     PSA SCREENING (SCREENING); Future    6. Screening for cardiovascular condition  -     EXAM SCREENING AAA; Future    7. Personal history of tobacco use, presenting hazards to health  -     CT LOW DOSE LUNG CANCER SCREENING; Future                 Medication Side Effects and Warnings were discussed with patient,  Patient Labs were reviewed and or requested, and  Patient Past Records were reviewed and or requested  yes       I have discussed the diagnosis with the patient and the intended plan as seen in the above orders.  The patient has received an after-visit summary and questions were answered concerning future plans.       Pt agrees to call or return to clinic and/or go to closest ER with any worsening of symptoms.  This may include, but not limited to increased fever (>100.4) with NSAIDS or Tylenol, increased edema, confusion, rash, worsening of presenting symptoms.    Please note that this dictation was completed with Dragon, the computer voice recognition software.  Quite often unanticipated grammatical, syntax, homophones, and other interpretive errors are inadvertently transcribed by the computer software.  Please disregard these errors.  Please excuse any errors that have escaped final proofreading.  Thank you.  This is the Subsequent Medicare Annual Wellness Exam, performed 12 months or more after the Initial AWV or the last Subsequent AWV    I have reviewed the patient's medical history in detail and updated the computerized patient record.       Assessment/Plan   Education and counseling provided:  Are appropriate  based on today's review and evaluation    1. Medicare annual wellness visit,  subsequent  2. Screening for depression  -     DEPRESSION SCREEN ANNUAL  3. Essential hypertension  4. Screening for ischemic heart disease  -     LIPID PANEL; Future  5. Special screening for malignant neoplasm of prostate  -     PSA SCREENING (SCREENING); Future  6. Screening for cardiovascular condition  -     US EXAM SCREENING AAA; Future  7. Personal history of tobacco use, presenting hazards to health  -     CT LOW DOSE LUNG CANCER SCREENING; Future       Depression Risk Factor Screening   No flowsheet data found.    Alcohol Risk Screen    Do you average more than 1 drink per night or more than 7 drinks a week: No    In the past three months have you have had more than 4 drinks containing alcohol on one occasion: No        Functional Ability and Level of Safety    Hearing: Hearing is good.      Activities of Daily Living:  The home contains: no safety equipment.  Patient does total self care      Ambulation: with no difficulty     Fall Risk:  Fall Risk Assessment, last 12 mths 06/26/2020   Able to walk? Yes   Fall in past 12 months? 0   Do you feel unsteady? 0   Are you worried about falling 0      Abuse Screen:  Patient is not abused       Cognitive Screening    Has your family/caregiver stated any concerns about your memory: no     Cognitive Screening: Normal - Clock Drawing Test    Health Maintenance Due     Health Maintenance Due   Topic Date Due   ??? COVID-19 Vaccine (1) Never done   ??? Shingrix Vaccine Age 64> (1 of 2) Never done   ??? Low dose CT lung screening  Never done   ??? AAA Screening 5065-66 YO Male Smoking Patients  Never done   ??? Medicare Yearly Exam  Never done       Patient Care Team   Patient Care Team:  Cassell ClementBala, Lief Palmatier K, MD as PCP - General (Family Medicine)  Cassell ClementBala, Ivis Henneman K, MD as PCP - [facility]  Chaudhry, Fulton MoleMohammad Sohail, MD as Physician (Cardiology)    History     Patient Active Problem List   Diagnosis Code   ??? Essential hypertension I10   ??? Hep C w/ coma, chronic B18.2   ???  Depression F32.9   ??? Heavy smoker F17.200     Past Medical History:   Diagnosis Date   ??? Arthritis    ??? Depression    ??? Depression 04/25/2016   ??? Depression 04/25/2016   ??? Essential hypertension 04/25/2016   ??? Essential hypertension 04/25/2016   ??? Heavy smoker 04/25/2016   ??? Heavy smoker 04/25/2016   ??? Hep C w/ coma, chronic 04/25/2016   ??? Hep C w/ coma, chronic 04/25/2016   ??? Hypertension    ??? Liver disease       Past Surgical History:   Procedure Laterality Date   ??? COLONOSCOPY N/A 04/29/2017    [facility]ace J [facility]   ??? HX ORTHOPAEDIC      right leg   ???  HX OTHER SURGICAL      abscess     Current Outpatient Medications   Medication Sig Dispense Refill   ??? olmesartan (BENICAR) 40 mg tablet TAKE ONE TABLET BY MOUTH DAILY 90 Tablet 0     No Known Allergies    Family History   Problem Relation Age of Onset   ??? Cancer Mother    ??? Heart Disease Mother    ??? Hypertension Mother    ??? Asthma Sister    ??? Migraines Maternal Aunt    ??? Cancer Maternal Uncle    ??? Migraines Maternal Uncle    ??? Hypertension Maternal Uncle    ??? Cancer Maternal Grandmother    ??? Migraines Maternal Grandmother    ??? Hypertension Maternal Grandmother      Social History     Tobacco Use   ??? Smoking status: Current Some Day Smoker     Packs/day: 1.00     Years: 50.00     Pack years: 50.00   ??? Smokeless tobacco: Never Used   Substance Use Topics   ??? Alcohol use: No         Shawn Jackson

## 2020-06-26 NOTE — Progress Notes (Signed)
This is the Subsequent Medicare Annual Wellness Exam, performed 12 months or more after the Initial AWV or the last Subsequent AWV    I have reviewed the patient's medical history in detail and updated the computerized patient record.       Assessment/Plan   Education and counseling provided:  Are appropriate based on today's review and evaluation    1. Medicare annual wellness visit, subsequent  2. Screening for depression  -     DEPRESSION SCREEN ANNUAL  3. Essential hypertension  4. Screening for ischemic heart disease  -     LIPID PANEL; Future  5. Special screening for malignant neoplasm of prostate  -     PSA SCREENING (SCREENING); Future  6. Screening for cardiovascular condition  -     US EXAM SCREENING AAA; Future  7. Personal history of tobacco use, presenting hazards to health  -     CT LOW DOSE LUNG CANCER SCREENING; Future       Depression Risk Factor Screening   No flowsheet data found.    Alcohol Risk Screen    Do you average more than 1 drink per night or more than 7 drinks a week: No    In the past three months have you have had more than 4 drinks containing alcohol on one occasion: No        Functional Ability and Level of Safety    Hearing: Hearing is good.      Activities of Daily Living:  The home contains: no safety equipment.  Patient does total self care      Ambulation: with no difficulty     Fall Risk:  Fall Risk Assessment, last 12 mths 06/26/2020   Able to walk? Yes   Fall in past 12 months? 0   Do you feel unsteady? 0   Are you worried about falling 0      Abuse Screen:  Patient is not abused       Cognitive Screening    Has your family/caregiver stated any concerns about your memory: no     Cognitive Screening: Normal - MMSE (Mini Mental Status Exam)    Health Maintenance Due     Health Maintenance Due   Topic Date Due   ??? COVID-19 Vaccine (1) Never done   ??? Shingrix Vaccine Age 20> (1 of 2) Never done   ??? Low dose CT lung screening  Never done   ??? AAA Screening 37-66 YO Male Smoking  Patients  Never done   ??? Medicare Yearly Exam  Never done       Patient Care Team   Patient Care Team:  Cassell Clement, MD as PCP - General (Family Medicine)  Cassell Clement, MD as PCP - [facility] Empaneled Provider  Chaudhry, Fulton Mole, MD as Physician (Cardiology)    History     Patient Active Problem List   Diagnosis Code   ??? Essential hypertension I10   ??? Hep C w/ coma, chronic B18.2   ??? Depression F32.9   ??? Heavy smoker F17.200     Past Medical History:   Diagnosis Date   ??? Arthritis    ??? Depression    ??? Depression 04/25/2016   ??? Depression 04/25/2016   ??? Essential hypertension 04/25/2016   ??? Essential hypertension 04/25/2016   ??? Heavy smoker 04/25/2016   ??? Heavy smoker 04/25/2016   ??? Hep C w/ coma, chronic 04/25/2016   ??? Hep C w/ coma, chronic 04/25/2016   ???  Hypertension    ??? Liver disease       Past Surgical History:   Procedure Laterality Date   ??? COLONOSCOPY N/A 04/29/2017    COLONOSCOPY performed by Levonne Spiller, MD at [facility] MAIN OR   ??? HX ORTHOPAEDIC      right leg   ??? HX OTHER SURGICAL      abscess     Current Outpatient Medications   Medication Sig Dispense Refill   ??? olmesartan (BENICAR) 40 mg tablet TAKE ONE TABLET BY MOUTH DAILY 90 Tablet 0     No Known Allergies    Family History   Problem Relation Age of Onset   ??? Cancer Mother    ??? Heart Disease Mother    ??? Hypertension Mother    ??? Asthma Sister    ??? Migraines Maternal Aunt    ??? Cancer Maternal Uncle    ??? Migraines Maternal Uncle    ??? Hypertension Maternal Uncle    ??? Cancer Maternal Grandmother    ??? Migraines Maternal Grandmother    ??? Hypertension Maternal Grandmother      Social History     Tobacco Use   ??? Smoking status: Current Some Day Smoker     Packs/day: 1.00     Years: 50.00     Pack years: 50.00   ??? Smokeless tobacco: Never Used   Substance Use Topics   ??? Alcohol use: No         Cassell Clement, MD   Discussed with patient current guidelines for screening for lung cancer.  Current recommendations are to obtain yearly screening LDCT yearly for age  76-80, or until smoke free for 15 years.  Patient has 50 pack year history of cigarette smoking and currently smoking 1/2 ppd.  Discussed with patient risks and benefits of screening, including over-diagnosis, false positive rate, and total radiation exposure.  Patient currently exhibits no signs or symptoms suggestive of lung cancer.  Discussed with patient importance of compliance with yearly annual lung cancer screenings and willingness to undergo diagnosis and treatment if screening scan is positive.  In addition, patient was counseled regarding (remaining smoke free/total smoking cessation).

## 2020-06-26 NOTE — Addendum Note (Signed)
Addendum Note by  Dewayne Hatch at 06/26/20 1015                Author: Dewayne Hatch  Service: --  Author Type: Technician       Filed: 06/26/20 1059  Encounter Date: 06/26/2020  Status: Signed          Editor: Dewayne Hatch (Technician)          Addended by: Dewayne Hatch on: 06/26/2020 10:59 AM    Modules accepted: Orders

## 2020-07-18 ENCOUNTER — Inpatient Hospital Stay: Admit: 2020-07-18 | Payer: MEDICARE | Attending: Family Medicine | Primary: Family Medicine

## 2020-07-18 DIAGNOSIS — Z136 Encounter for screening for cardiovascular disorders: Secondary | ICD-10-CM

## 2020-07-24 NOTE — Telephone Encounter (Signed)
Patient called to discuss imaging results of CT and U/S, third attempt-per patient. Please contact patient as soon as possible. Thanks!

## 2020-07-25 NOTE — Telephone Encounter (Signed)
Contacted pt. Made aware of results. He stated understanding.

## 2020-07-25 NOTE — Telephone Encounter (Signed)
-----   Message from Cassell Clement, MD sent at 07/21/2020 11:14 AM EDT -----  Regarding: RE: Dr.Bala/Telephone  There is no MRI ordered CT scan was normal  ----- Message -----  From: Samule Ohm  Sent: 07/21/2020  10:42 AM EDT  To: Cassell Clement, MD  Subject: FW: Dr.Bala/Telephone                              ----- Message -----  From: Glennis Brink  Sent: 07/21/2020  10:20 AM EDT  To: [facility] Front Office  Subject: Dr.Bala/Telephone                                General Message/Vendor Calls    Caller's first and last name:NA      Reason for call:Results      Callback required yes/no and why:Y      Best contact number(s):[phone]      Details to clarify the request: Want MRI  results.      [employer]

## 2020-09-25 MED ORDER — OLMESARTAN 40 MG TAB
40 mg | ORAL_TABLET | ORAL | 0 refills | Status: DC
Start: 2020-09-25 — End: 2020-12-25

## 2020-11-01 ENCOUNTER — Telehealth: Attending: Family Medicine | Primary: Family Medicine

## 2020-11-01 ENCOUNTER — Telehealth: Admit: 2020-11-01 | Discharge: 2020-11-01 | Payer: MEDICARE | Attending: Family Medicine | Primary: Family Medicine

## 2020-11-01 DIAGNOSIS — I10 Essential (primary) hypertension: Secondary | ICD-10-CM

## 2020-11-01 NOTE — Progress Notes (Signed)
Shawn Jackson is a 65 y.o. male who was seen by synchronous (real-time) audio-video technology on 11/01/2020 for No chief complaint on file.        Assessment & Plan:   Diagnoses and all orders for this visit:    1. Essential hypertension    2. 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 No acute findings meriting change in the plan      3. Dizziness      Patient return to clinic for orthostatic vitals, unable to assess today due to lack of blood pressure cuff with patient  Patient proceed to ER for any worsening symptoms  Subjective:     Patient is a 65 year old male who is following up for blood pressure.  Patient states he is taking his medication daily with no chest pain shortness of breath or DOE.  Patient does state that he notices when he gets up from a sitting to standing position or bending over he will feel dizzy for 1 minute.  Is been ongoing over several months.  Prior to Admission medications    Medication Sig Start Date End Date Taking? Authorizing Provider   olmesartan (BENICAR) 40 mg tablet TAKE ONE TABLET BY MOUTH DAILY 09/25/20   Cassell Clement, MD     Patient Active Problem List    Diagnosis Date Noted   ??? Essential hypertension 04/25/2016   ??? 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 Depression 04/25/2016   ??? Heavy smoker 04/25/2016     Current Outpatient Medications   Medication Sig Dispense Refill   ??? olmesartan (BENICAR) 40 mg tablet TAKE ONE TABLET BY MOUTH DAILY 90 Tablet 0     No Known Allergies  Past Medical History:   Diagnosis Date   ??? Arthritis    ??? Depression    ??? Depression 04/25/2016   ??? Depression 04/25/2016   ??? Essential hypertension 04/25/2016   ??? Essential hypertension 04/25/2016   ??? Heavy smoker 04/25/2016   ??? Heavy smoker 04/25/2016   ??? Hep C w/ coma, chronic 04/25/2016   ??? Hep C w/ coma, chronic 04/25/2016   ??? Hypertension    ??? Liver disease      Past Surgical History:   Procedure Laterality Date   ??? COLONOSCOPY N/A 04/29/2017     COLONOSCOPY performed by Levonne Spiller, MD at [facility] MAIN OR   ??? HX ORTHOPAEDIC      right leg   ??? HX OTHER SURGICAL      abscess     Family History   Problem Relation Age of Onset   ??? Cancer Mother    ??? Heart Disease Mother    ??? Hypertension Mother    ??? Asthma Sister    ??? Migraines Maternal Aunt    ??? Cancer Maternal Uncle    ??? Migraines Maternal Uncle    ??? Hypertension Maternal Uncle    ??? Cancer Maternal Grandmother    ??? Migraines Maternal Grandmother    ??? Hypertension Maternal Grandmother      Social History     Tobacco Use   ??? Smoking status: Current Some Day Smoker     Packs/day: 1.00     Years: 50.00     Pack years: 50.00   ??? Smokeless tobacco: Never Used   Substance Use Topics   ??? Alcohol use: No       ROS    Objective:   No  flowsheet data found.     [INSTRUCTIONS:  "[x] " Indicates a positive item  "[] " Indicates a negative item  -- DELETE ALL ITEMS NOT EXAMINED]    Constitutional: [x]  Appears well-developed and well-nourished [x]  No apparent distress      []  Abnormal -     Mental status: [x]  Alert and awake  [x]  Oriented to person/place/time [x]  Able to follow commands    []  Abnormal -     Eyes:   EOM    [x]   Normal    []  Abnormal -   Sclera  [x]   Normal    []  Abnormal -          Discharge [x]   None visible   []  Abnormal -     HENT: [x]  Normocephalic, atraumatic  []  Abnormal -   [x]  Mouth/Throat: Mucous membranes are moist    External Ears [x]  Normal  []  Abnormal -    Neck: [x]  No visualized mass []  Abnormal -     Pulmonary/Chest: [x]  Respiratory effort normal   [x]  No visualized signs of difficulty breathing or respiratory distress        []  Abnormal -      Musculoskeletal:   [x]  Normal gait with no signs of ataxia         [x]  Normal range of motion of neck        []  Abnormal -     Neurological:        [x]  No Facial Asymmetry (Cranial nerve 7 motor function) (limited exam due to video visit)          [x]  No gaze palsy        []  Abnormal -          Skin:        [x]  No significant exanthematous lesions or  discoloration noted on facial skin         []  Abnormal -            Psychiatric:       [x]  Normal Affect []  Abnormal -        [x]  No Hallucinations    Other pertinent observable physical exam findings:-        We discussed the expected course, resolution and complications of the diagnosis(es) in detail.  Medication risks, benefits, costs, interactions, and alternatives were discussed as indicated.  I advised him to contact the office if his condition worsens, changes or fails to improve as anticipated. He expressed understanding with the diagnosis(es) and plan.       , was evaluated through a synchronous (real-time) audio-video encounter. The patient (or guardian if applicable) is aware that this is a billable service. Verbal consent to proceed has been obtained within the past 12 months. The visit was conducted pursuant to the emergency declaration under the and the , 1135 waiver authority and the and Act.  Patient identification was verified, and a caregiver was present when appropriate. The patient was located in a state where the provider was credentialed to provide care.      , MD

## 2020-11-01 NOTE — Assessment & Plan Note (Signed)
monitored by specialist. No acute findings meriting change in the plan

## 2020-11-01 NOTE — Addendum Note (Signed)
Addendum  Note by Cassell Clement, MD at 11/01/20 1515                Author: Cassell Clement, MD  Service: --  Author Type: Physician       Filed: 11/01/20 1534  Encounter Date: 11/01/2020  Status: Signed          Editor: Cassell Clement, MD (Physician)          Addended by: Cassell Clement on: 11/01/2020 03:34 PM    Modules accepted: Orders

## 2020-11-15 ENCOUNTER — Encounter: Attending: Family Medicine | Primary: Family Medicine

## 2020-12-04 ENCOUNTER — Encounter: Attending: Family Medicine | Primary: Family Medicine

## 2020-12-07 ENCOUNTER — Encounter: Payer: MEDICARE | Attending: Family Medicine | Primary: Family Medicine

## 2020-12-25 ENCOUNTER — Ambulatory Visit: Attending: Family Medicine | Primary: Family Medicine

## 2020-12-25 ENCOUNTER — Ambulatory Visit: Admit: 2020-12-25 | Discharge: 2020-12-25 | Payer: MEDICARE | Attending: Family Medicine | Primary: Family Medicine

## 2020-12-25 DIAGNOSIS — I10 Essential (primary) hypertension: Secondary | ICD-10-CM

## 2020-12-25 NOTE — Progress Notes (Signed)
No chief complaint on file.    Pt who presents for follow up of a pre-existing problem of hypertension.    Diet and Lifestyle: not attempting to follow a low fat, low cholesterol diet  Home BP Monitoring: Reviewed blood pressure diary with patient at todays visit.  Use of agents associated with hypertension: none.  Cardiovascular ROS: taking medications as instructed, no medication side effects noted, no TIA's, no chest pain on exertion, no dyspnea on exertion, no swelling of ankles.     New concerns: Fainting 2 and half weeks ago.  Patient states he stopped taking his medication and since then has felt less dizziness.  He has had these fainting episodes over the last few years intermittently, has been worked up by cardiology with negative work-up.     Reviewed and agree with Nurse Note and duplicated in this note.  Reviewed PmHx, RxHx, FmHx, SocHx, AllgHx and updated and dated in the chart.    Family History   Problem Relation Age of Onset   ??? Cancer Mother    ??? Heart Disease Mother    ??? Hypertension Mother    ??? Asthma Sister    ??? Migraines Maternal Aunt    ??? Cancer Maternal Uncle    ??? Migraines Maternal Uncle    ??? Hypertension Maternal Uncle    ??? Cancer Maternal Grandmother    ??? Migraines Maternal Grandmother    ??? Hypertension Maternal Grandmother        Past Medical History:   Diagnosis Date   ??? Arthritis    ??? Depression    ??? Depression 04/25/2016   ??? Depression 04/25/2016   ??? Essential hypertension 04/25/2016   ??? Essential hypertension 04/25/2016   ??? Heavy smoker 04/25/2016   ??? Heavy smoker 04/25/2016   ??? Hep C w/ coma, chronic 04/25/2016   ??? Hep C w/ coma, chronic 04/25/2016   ??? Hypertension    ??? Liver disease       Social History     Socioeconomic History   ??? Marital status: SINGLE   Tobacco Use   ??? Smoking status: Current Some Day Smoker     Packs/day: 1.00     Years: 50.00     Pack years: 50.00   ??? Smokeless tobacco: Never Used   Substance and Sexual Activity   ??? Alcohol use: No   ??? Drug use: Yes     Types:  Marijuana   ??? Sexual activity: Yes     Partners: Female     Birth control/protection: Condom        Review of Systems - negative except as listed above      Objective:     Vitals:    12/25/20 1559 12/25/20 1612 12/25/20 1613   BP: 129/80 (!) 146/81 124/81   Pulse: 80 76 90   Resp: 16     Temp: 98.1 ??F (36.7 ??C)     SpO2: 97%     Weight: 139 lb 11.2 oz (63.4 kg)     Height: 5\' 10"  (1.778 m)         Physical Examination: General appearance - alert, well appearing, and in no distress  Eyes - pupils equal and reactive, extraocular eye movements intact  Ears - bilateral TM's and external ear canals normal  Nose - normal and patent, no erythema, discharge or polyps  Mouth - mucous membranes moist, pharynx normal without lesions  Neck - supple, no significant adenopathy  Chest - clear to auscultation, no  wheezes, rales or rhonchi, symmetric air entry  Heart - normal rate, regular rhythm, normal S1, S2, no murmurs, rubs, clicks or gallops  Abdomen - soft, nontender, nondistended, no masses or organomegaly  Musculoskeletal - no joint tenderness, deformity or swelling  Extremities - peripheral pulses normal, no pedal edema, no clubbing or cyanosis  Skin - normal coloration and turgor, no rashes, no suspicious skin lesions noted     Assessment/ Plan:   Diagnoses and all orders for this visit:    1. Essential hypertension  -     REFERRAL TO CARDIOLOGY    2. Orthostatic hypotension  -     REFERRAL TO CARDIOLOGY         Since patient has several syncopal episodes we will continue to hold off on his blood pressure medication, blood pressure is normal currently.  Continue to monitor at home and will follow up in 2 months for blood pressure        Medication Side Effects and Warnings were discussed with patient,  Patient Labs were reviewed and or requested, and  Patient Past Records were reviewed and or requested  yes       I have discussed the diagnosis with the patient and the intended plan as seen in the above orders.  The  patient has received an after-visit summary and questions were answered concerning future plans.       Pt agrees to call or return to clinic and/or go to closest ER with any worsening of symptoms.  This may include, but not limited to increased fever (>100.4) with NSAIDS or Tylenol, increased edema, confusion, rash, worsening of presenting symptoms.    Please note that this dictation was completed with Dragon, the computer voice recognition software.  Quite often unanticipated grammatical, syntax, homophones, and other interpretive errors are inadvertently transcribed by the computer software.  Please disregard these errors.  Please excuse any errors that have escaped final proofreading.  Thank you.

## 2020-12-25 NOTE — Addendum Note (Signed)
Addendum Note by  Dewayne Hatch at 12/25/20 1545                Author: Dewayne Hatch  Service: --  Author Type: Technician       Filed: 12/25/20 1632  Encounter Date: 12/25/2020  Status: Signed          Editor: Dewayne Hatch (Technician)          Addended by: Dewayne Hatch on: 12/25/2020 04:32 PM    Modules accepted: Orders

## 2020-12-26 ENCOUNTER — Encounter

## 2020-12-26 LAB — CBC W/O DIFF
ABSOLUTE NRBC: 0 10*3/uL (ref 0.00–0.01)
HCT: 41.3 % (ref 36.6–50.3)
HGB: 13.1 g/dL (ref 12.1–17.0)
MCH: 29.8 PG (ref 26.0–34.0)
MCHC: 31.7 g/dL (ref 30.0–36.5)
MCV: 94.1 FL (ref 80.0–99.0)
MPV: 11.4 FL (ref 8.9–12.9)
NRBC: 0 PER 100 WBC
PLATELET: 187 10*3/uL (ref 150–400)
RBC: 4.39 M/uL (ref 4.10–5.70)
RDW: 14.6 % — ABNORMAL HIGH (ref 11.5–14.5)
WBC: 6.6 10*3/uL (ref 4.1–11.1)

## 2020-12-26 LAB — METABOLIC PANEL, COMPREHENSIVE
A-G Ratio: 0.8 — ABNORMAL LOW (ref 1.1–2.2)
ALT (SGPT): 11 U/L — ABNORMAL LOW (ref 12–78)
AST (SGOT): 13 U/L — ABNORMAL LOW (ref 15–37)
Albumin: 3.2 g/dL — ABNORMAL LOW (ref 3.5–5.0)
Alk. phosphatase: 81 U/L (ref 45–117)
Anion gap: 3 mmol/L — ABNORMAL LOW (ref 5–15)
BUN/Creatinine ratio: 10 — ABNORMAL LOW (ref 12–20)
BUN: 12 MG/DL (ref 6–20)
Bilirubin, total: 0.6 MG/DL (ref 0.2–1.0)
CO2: 29 mmol/L (ref 21–32)
Calcium: 9 MG/DL (ref 8.5–10.1)
Chloride: 102 mmol/L (ref 97–108)
Creatinine: 1.21 MG/DL (ref 0.70–1.30)
GFR est AA: >60 mL/min/{1.73_m2} (ref >60)
GFR est non-AA: 60 mL/min/{1.73_m2} — ABNORMAL LOW (ref >60)
Globulin: 4.1 g/dL — ABNORMAL HIGH (ref 2.0–4.0)
Glucose: 121 mg/dL — ABNORMAL HIGH (ref 65–100)
Potassium: 4.1 mmol/L (ref 3.5–5.1)
Protein, total: 7.3 g/dL (ref 6.4–8.2)
Sodium: 134 mmol/L — ABNORMAL LOW (ref 136–145)

## 2020-12-26 LAB — CBC
Hematocrit: 41.3 % (ref 36.6–50.3)
Hemoglobin: 13.1 g/dL (ref 12.1–17.0)
MCH: 29.8 PG (ref 26.0–34.0)
MCHC: 31.7 g/dL (ref 30.0–36.5)
MCV: 94.1 FL (ref 80.0–99.0)
MPV: 11.4 FL (ref 8.9–12.9)
NRBC Absolute: 0 10*3/uL (ref 0.00–0.01)
Nucleated RBCs: 0 PER 100 WBC
Platelets: 187 10*3/uL (ref 150–400)
RBC: 4.39 M/uL (ref 4.10–5.70)
RDW: 14.6 % — ABNORMAL HIGH (ref 11.5–14.5)
WBC: 6.6 10*3/uL (ref 4.1–11.1)

## 2020-12-26 LAB — COMPREHENSIVE METABOLIC PANEL
ALT: 11 U/L — ABNORMAL LOW (ref 12–78)
AST: 13 U/L — ABNORMAL LOW (ref 15–37)
Albumin/Globulin Ratio: 0.8 — ABNORMAL LOW (ref 1.1–2.2)
Albumin: 3.2 g/dL — ABNORMAL LOW (ref 3.5–5.0)
Alkaline Phosphatase: 81 U/L (ref 45–117)
Anion Gap: 3 mmol/L — ABNORMAL LOW (ref 5–15)
BUN: 12 MG/DL (ref 6–20)
Bun/Cre Ratio: 10 — ABNORMAL LOW (ref 12–20)
CO2: 29 mmol/L (ref 21–32)
Calcium: 9 MG/DL (ref 8.5–10.1)
Chloride: 102 mmol/L (ref 97–108)
Creatinine: 1.21 MG/DL (ref 0.70–1.30)
EGFR IF NonAfrican American: 60 mL/min/{1.73_m2} — ABNORMAL LOW (ref >60)
GFR African American: >60 mL/min/{1.73_m2} (ref >60)
Globulin: 4.1 g/dL — ABNORMAL HIGH (ref 2.0–4.0)
Glucose: 121 mg/dL — ABNORMAL HIGH (ref 65–100)
Potassium: 4.1 mmol/L (ref 3.5–5.1)
Sodium: 134 mmol/L — ABNORMAL LOW (ref 136–145)
Total Bilirubin: 0.6 MG/DL (ref 0.2–1.0)
Total Protein: 7.3 g/dL (ref 6.4–8.2)

## 2021-03-13 ENCOUNTER — Encounter: Attending: Family Medicine | Primary: Family Medicine

## 2021-03-28 ENCOUNTER — Ambulatory Visit: Attending: Family Medicine | Primary: Family Medicine

## 2021-03-28 ENCOUNTER — Ambulatory Visit: Admit: 2021-03-28 | Discharge: 2021-03-28 | Payer: MEDICARE | Attending: Family Medicine | Primary: Family Medicine

## 2021-03-28 DIAGNOSIS — R1310 Dysphagia, unspecified: Secondary | ICD-10-CM

## 2021-03-28 MED ORDER — FAMOTIDINE 40 MG TAB
40 mg | ORAL_TABLET | Freq: Every day | ORAL | 1 refills | Status: AC
Start: 2021-03-28 — End: ?

## 2021-03-28 NOTE — Progress Notes (Signed)
Chief Complaint   Patient presents with   ??? Sore Throat     Patient is here for a sore throat        65 y.o. male with sore throat, myalgias, swollen glands, headache for 6 months  days. No history of rheumatic fever. Other symptoms: congestion, sneezing and dry cough.  Patient is asymptomatic today  Fever -no  Exudative pharyngitis-no  Lymphadenopathy-no  Absence of cough-yes  Reviewed and agree with Nurse Note and duplicated in this note.  Reviewed PmHx, RxHx, FmHx, SocHx, AllgHx and updated and dated in the chart.    Family History   Problem Relation Age of Onset   ??? Cancer Mother    ??? Heart Disease Mother    ??? Hypertension Mother    ??? Asthma Sister    ??? Migraines Maternal Aunt    ??? Cancer Maternal Uncle    ??? Migraines Maternal Uncle    ??? Hypertension Maternal Uncle    ??? Cancer Maternal Grandmother    ??? Migraines Maternal Grandmother    ??? Hypertension Maternal Grandmother        Past Medical History:   Diagnosis Date   ??? Arthritis    ??? Depression    ??? Depression 04/25/2016   ??? Depression 04/25/2016   ??? Essential hypertension 04/25/2016   ??? Essential hypertension 04/25/2016   ??? Heavy smoker 04/25/2016   ??? Heavy smoker 04/25/2016   ??? Hep C w/ coma, chronic 04/25/2016   ??? Hep C w/ coma, chronic 04/25/2016   ??? Hypertension    ??? Liver disease       Social History     Socioeconomic History   ??? Marital status: SINGLE   Tobacco Use   ??? Smoking status: Current Some Day Smoker     Packs/day: 1.00     Years: 50.00     Pack years: 50.00   ??? Smokeless tobacco: Never Used   Substance and Sexual Activity   ??? Alcohol use: No   ??? Drug use: Yes     Types: Marijuana   ??? Sexual activity: Yes     Partners: Female     Birth control/protection: Condom        Review of Systems - negative except as listed above      Objective:     Vitals:    03/28/21 1028   BP: (!) 143/84   Pulse: 88   Resp: 16   Temp: 98.4 ??F (36.9 ??C)   SpO2: 97%   Weight: 128 lb (58.1 kg)   Height: 5\' 10"  (1.778 m)       Physical Examination: General appearance - alert, well  appearing, and in no distress  Eyes - pupils equal and reactive, extraocular eye movements intact  Ears - bilateral TM's and external ear canals normal  Nose - normal and patent, no erythema, discharge or polyps  Mouth - mucous membranes moist, pharynx normal without lesions and thrush noted  Neck - supple, no significant adenopathy  Chest - clear to auscultation, no wheezes, rales or rhonchi, symmetric air entry  Heart - normal rate, regular rhythm, normal S1, S2, no murmurs, rubs, clicks or gallops  Abdomen - soft, nontender, nondistended, no masses or organomegaly  Musculoskeletal - no joint tenderness, deformity or swelling  Extremities - peripheral pulses normal, no pedal edema, no clubbing or cyanosis  Skin - normal coloration and turgor, no rashes, no suspicious skin lesions noted     Assessment/ Plan:   Diagnoses and  all orders for this visit:    1. Dysphagia, unspecified type  -     REFERRAL TO ENT-OTOLARYNGOLOGY    2. Weight loss  -     REFERRAL TO ENT-OTOLARYNGOLOGY  -     REFERRAL TO GASTROENTEROLOGY  -     TSH 3RD GENERATION; Future  -     HIV 1/2 AG/AB, 4TH GENERATION,W RFLX CONFIRM; Future  -     CBC WITH AUTOMATED DIFF; Future    3. Elevated blood sugar  -     HEMOGLOBIN A1C WITH EAG; Future    Other orders  -     famotidine (PEPCID) 40 mg tablet; Take 1 Tablet by mouth daily.    Unknown cause for weight loss, will get opinion from GI  Will do trial of PPI and await ENT referral          1) Remember to stay active and/or exercise regularly (I suggest 30-45 minutes daily)   2) For reliable dietary information, go to www.[employer].fi. You may wish to consider seeing the nutritionist at [facility] [phone], also consider the Mediterranean diet.  3) I routinely suggest a complete physical exam once each year (your birth month)  I have discussed the diagnosis with the patient and the intended plan as seen in the above orders.  The patient has received an after-visit summary and questions were  answered concerning future plans.     Medication Side Effects and Warnings were discussed with patient: yes  Patient Labs were reviewed and or requested: yes  Patient Past Records were reviewed and or requested  yes  I have discussed the diagnosis with the patient and the intended plan as seen in the above orders.      Pt agrees to call or return to clinic and/or go to closest ER with any worsening of symptoms.  This may include, but not limited to increased fever (>100.4) with NSAIDS or Tylenol, increased edema, confusion, rash, worsening of presenting symptoms.    Please note that this dictation was completed with Dragon, the computer voice recognition software.  Quite often unanticipated grammatical, syntax, homophones, and other interpretive errors are inadvertently transcribed by the computer software.  Please disregard these errors.  Please excuse any errors that have escaped final proofreading.  Thank you.

## 2021-03-29 ENCOUNTER — Encounter

## 2021-03-29 LAB — CBC WITH AUTOMATED DIFF
ABS. BASOPHILS: 0.1 10*3/uL (ref 0.0–0.1)
ABS. EOSINOPHILS: 0.2 10*3/uL (ref 0.0–0.4)
ABS. IMM. GRANS.: 0 10*3/uL (ref 0.00–0.04)
ABS. LYMPHOCYTES: 0.8 10*3/uL (ref 0.8–3.5)
ABS. MONOCYTES: 0.3 10*3/uL (ref 0.0–1.0)
ABS. NEUTROPHILS: 4.6 10*3/uL (ref 1.8–8.0)
ABSOLUTE NRBC: 0 10*3/uL (ref 0.00–0.01)
BASOPHILS: 1 % (ref 0–1)
EOSINOPHILS: 3 % (ref 0–7)
HCT: 47.9 % (ref 36.6–50.3)
HGB: 15 g/dL (ref 12.1–17.0)
IMMATURE GRANULOCYTES: 0 % (ref 0.0–0.5)
LYMPHOCYTES: 14 % (ref 12–49)
MCH: 28.9 PG (ref 26.0–34.0)
MCHC: 31.3 g/dL (ref 30.0–36.5)
MCV: 92.3 FL (ref 80.0–99.0)
MONOCYTES: 5 % (ref 5–13)
MPV: 11.4 FL (ref 8.9–12.9)
NEUTROPHILS: 77 % — ABNORMAL HIGH (ref 32–75)
NRBC: 0 PER 100 WBC
PLATELET: 227 10*3/uL (ref 150–400)
RBC: 5.19 M/uL (ref 4.10–5.70)
RDW: 14.6 % — ABNORMAL HIGH (ref 11.5–14.5)
WBC: 6 10*3/uL (ref 4.1–11.1)

## 2021-03-29 LAB — HEMOGLOBIN A1C WITH EAG
Est. average glucose: 128 mg/dL
Hemoglobin A1c: 6.1 % — ABNORMAL HIGH (ref 4.0–5.6)

## 2021-03-29 LAB — HIV 1/2 AG/AB, 4TH GENERATION,W RFLX CONFIRM: HIV 1/2 Interpretation: NONREACTIVE

## 2021-03-29 LAB — TSH 3RD GENERATION
TSH: 0.08 u[IU]/mL — ABNORMAL LOW (ref 0.36–3.74)
TSH: 0.08 u[IU]/mL — ABNORMAL LOW (ref 0.36–3.74)

## 2021-03-29 LAB — CBC WITH AUTO DIFFERENTIAL
Basophils %: 1 % (ref 0–1)
Basophils Absolute: 0.1 10*3/uL (ref 0.0–0.1)
Eosinophils %: 3 % (ref 0–7)
Eosinophils Absolute: 0.2 10*3/uL (ref 0.0–0.4)
Granulocyte Absolute Count: 0 10*3/uL (ref 0.00–0.04)
Hematocrit: 47.9 % (ref 36.6–50.3)
Hemoglobin: 15 g/dL (ref 12.1–17.0)
Immature Granulocytes: 0 % (ref 0.0–0.5)
Lymphocytes %: 14 % (ref 12–49)
Lymphocytes Absolute: 0.8 10*3/uL (ref 0.8–3.5)
MCH: 28.9 PG (ref 26.0–34.0)
MCHC: 31.3 g/dL (ref 30.0–36.5)
MCV: 92.3 FL (ref 80.0–99.0)
MPV: 11.4 FL (ref 8.9–12.9)
Monocytes %: 5 % (ref 5–13)
Monocytes Absolute: 0.3 10*3/uL (ref 0.0–1.0)
NRBC Absolute: 0 10*3/uL (ref 0.00–0.01)
Neutrophils %: 77 % — ABNORMAL HIGH (ref 32–75)
Neutrophils Absolute: 4.6 10*3/uL (ref 1.8–8.0)
Nucleated RBCs: 0 PER 100 WBC
Platelets: 227 10*3/uL (ref 150–400)
RBC: 5.19 M/uL (ref 4.10–5.70)
RDW: 14.6 % — ABNORMAL HIGH (ref 11.5–14.5)
WBC: 6 10*3/uL (ref 4.1–11.1)

## 2021-03-29 LAB — HEMOGLOBIN A1C W/EAG
Hemoglobin A1C: 6.1 % — ABNORMAL HIGH (ref 4.0–5.6)
eAG: 128 mg/dL

## 2021-03-29 LAB — HIV 1/2 ANTIGEN/ANTIBODY, FOURTH GENERATION W/RFL: Interpretation: NONREACTIVE

## 2021-04-02 NOTE — Telephone Encounter (Signed)
Patient would like to know hislab results and you can put in [place]

## 2021-04-24 NOTE — Telephone Encounter (Signed)
Patient called wanting to know why he was prescribed the Pepcid. His number is [phone]

## 2021-08-01 NOTE — Telephone Encounter (Signed)
Formatting of this note might be different from the original.  Pt is requesting a sooner appt    Electronically signed by Griffith Citron at 08/01/2021 10:30 AM EDT

## 2021-08-01 NOTE — Telephone Encounter (Signed)
Formatting of this note might be different from the original.  Returned patient call, 2 patient identifier confirmed. Spoke with patient, he states that 1-2 weeks ago, rubbed right eye (OD) and noticed vision left eye (OS) blurred. Denies black/dark/gray only fuzzy when compared to right eye (OD). Last eye exam 2 years ago [place] [facility]. Offered appt for 08/10/2021 patient deferred, appt given for 08/2021  Electronically signed by Marissa Calamity, COT at 08/01/2021 10:54 AM EDT

## 2022-06-19 LAB — HEMOGLOBIN A1C, EXTERNAL: Hemoglobin A1C, External: 5.5 %

## 2022-06-20 NOTE — Telephone Encounter (Signed)
FYI

## 2022-06-20 NOTE — Telephone Encounter (Signed)
-----   Message from Lucila Maine sent at 06/20/2022  3:20 PM EDT -----  Subject: Message to Provider    QUESTIONS  Information for Provider? [employer] clinical team member called to notify pcp   that the pt was admitted to the hospital. Pt is at [facility] hospital. Pt   was admitted to hospital for having a stroke. The diagnoses number is   [id number]. Please contact pt if any questions.  ---------------------------------------------------------------------------  --------------  Cleotis Lema INFO  [phone]; OK to leave message on voicemail  ---------------------------------------------------------------------------  --------------  SCRIPT ANSWERS  Relationship to Patient? Covered Entity  Covered Entity Type? Health Insurance?  Representative Name? [employer]

## 2022-07-11 NOTE — Home Health (Signed)
Formatting of this note might be different from the original.  PATIENT RECEIVED SITTING ON HIS COUCH WATCHING TV UPON ARRIVAL. HE WAS ALERT AND ORIENTED X4 AND HIS MOOD WAS PLEASANT. PATIENT RESIDES ON 10TH FLOOR OF AN APARTMENT COMPLEX WITH OPERATIONAL ELEVATORS. HE UTILIZES A WALKER FOR SAFE AMBULATION AND ALSO HAS 2 CANES AS NEEDED. HOME APPEARS TO BE CLUTTERED AROUND HIS LIVING SPACE. HIS VSS AND PATIENT DENIED EXPERIENCING ANY PAIN. HE STATES HE DOES NOT FEEL DEPRESSED AND THAT HE IS IN THE PROCESS OF TRYING TO QUIT SMOKING. SKIN IS INTACT. GAIT APPEARS TO BE SOMEWHAT UNSTEADY AS PATIENT HAS RIGHT SIDED WEAKNESS AND HAS RT. SIDED FOOT DROP. INSTRUCTED ON FALLS PREVENTION AND SAFETY PROVIDED. INSTRUCTED ON PRESCRIBED TRAZODONE AND PLAVIX. UNDERSTANDING VOICED. SN POC IS TO VISIT 1OX0 FOR DISEASE PROCESS EDUCATION AND TO MONITOR RECOVERY FROM RECENT STROKE.  Electronically signed by Marylin Crosby, RN at 07/11/2022  5:12 PM EDT

## 2022-07-16 ENCOUNTER — Ambulatory Visit: Admit: 2022-07-16 | Discharge: 2022-07-16 | Payer: MEDICARE | Attending: Family Medicine | Primary: Family Medicine

## 2022-07-16 DIAGNOSIS — Z0000 Encounter for general adult medical examination without abnormal findings: Secondary | ICD-10-CM

## 2022-07-16 NOTE — Progress Notes (Signed)
Medicare Annual Wellness Visit    Shawn Jackson is here for Medicare AWV (Patient is here for a medicare wellness visit. )    Assessment & Plan   Medicare annual wellness visit, subsequent  Acute lacunar infarction (HCC)  Chronic hepatitis C without hepatic coma (HCC)  Essential hypertension  Hyperglycemia, unspecified  Personal history of tobacco use  -     PR VISIT TO DISCUSS LUNG CA SCREEN W LDCT  -     CT Lung Screen (Initial/Annual/Baseline); Future  Screening for cardiovascular condition  -     Lipid Panel; Future    Recommendations for Preventive Services Due: see orders and patient instructions/AVS.  Recommended screening schedule for the next 5-10 years is provided to the patient in written form: see Patient Instructions/AVS.     Return in 3 months (on 10/15/2022).     Subjective       Patient's complete Health Risk Assessment and screening values have been reviewed and are found in Flowsheets. The following problems were reviewed today and where indicated follow up appointments were made and/or referrals ordered.    Positive Risk Factor Screenings with Interventions:          Drug Use:          Interpretation:  1-2: Low level - Monitor, re-assess at a later date  3-5: Moderate level - Further Investigation  6-8: Substantial level - Intensive Assessment  9-10: Severe level - Intensive Assessment    Interventions:  See A/P for any pertinent orders        General HRA Questions:  Select all that apply: (!) New or Increased Pain    Pain Interventions:  See AVS for additional education material       Weight and Activity:  Physical Activity: Inactive (07/16/2022)    Exercise Vital Sign     Days of Exercise per Week: 0 days     Minutes of Exercise per Session: 0 min     On average, how many days per week do you engage in moderate to strenuous exercise (like a brisk walk)?: 0 days  Have you lost any weight without trying in the past 3 months?: No  Body mass index is 19.37 kg/m.      Inactivity Interventions:  See AVS  for additional education material      Dentist Screen:  Have you seen the dentist within the past year?: (!) No    Intervention:  See AVS for additional education material     Vision Screen:  Do you have difficulty driving, watching TV, or doing any of your daily activities because of your eyesight?: No  Have you had an eye exam within the past year?: (!) No  No results found.    Interventions:   See AVS for additional education material      Advanced Directives:  Do you have a Living Will?: (!) No    Intervention:  see ACP note        Tobacco Use:  Tobacco Use: High Risk (07/16/2022)    Patient History     Smoking Tobacco Use: Some Days     Smokeless Tobacco Use: Never     Passive Exposure: Not on file     E-cigarette/Vaping       Questions Responses    E-cigarette/Vaping Use     Start Date     Passive Exposure     Quit Date     Counseling Given     Comments  Interventions:  See A/P for plan and any pertinent orders                        Objective   Vitals:    07/16/22 1207   BP: 124/81   Pulse: 93   Resp: 16   Temp: 98.2 F (36.8 C)   SpO2: 96%   Weight: 135 lb (61.2 kg)   Height: 5\' 10"  (1.778 m)      Body mass index is 19.37 kg/m.        General Appearance: alert and oriented to person, place and time, well developed and well- nourished, in no acute distress  Skin: warm and dry, no rash or erythema  Head: normocephalic and atraumatic  Eyes: pupils equal, round, and reactive to light, extraocular eye movements intact, conjunctivae normal  ENT: tympanic membrane, external ear and ear canal normal bilaterally, nose without deformity, nasal mucosa and turbinates normal without polyps  Neck: supple and non-tender without mass, no thyromegaly or thyroid nodules, no cervical lymphadenopathy  Pulmonary/Chest: clear to auscultation bilaterally- no wheezes, rales or rhonchi, normal air movement, no respiratory distress  Cardiovascular: normal rate, regular rhythm, normal S1 and S2, no murmurs, rubs, clicks, or  gallops, distal pulses intact, no carotid bruits  Abdomen: soft, non-tender, non-distended, normal bowel sounds, no masses or organomegaly  Extremities: no cyanosis, clubbing or edema  Musculoskeletal: normal range of motion, no joint swelling, deformity or tenderness  Neurologic: reflexes normal and symmetric, no cranial nerve deficit, gait, coordination and speech normal       No Known Allergies  Prior to Visit Medications    Medication Sig Taking? Authorizing Provider   famotidine (PEPCID) 40 MG tablet Take 1 tablet by mouth daily  Ar Automatic Reconciliation       CareTeam (Including outside providers/suppliers regularly involved in providing care):   Patient Care Team:  Cassell Clementishi K Hektor Huston, MD as PCP - General  Cassell Clementishi K Zamzam Whinery, MD as PCP - Empaneled Provider  Gladstone PihMohammad S A Chaudhry, MD as Physician     Reviewed and updated this visit:  Tobacco  Allergies  Meds  Problems  Med Hx  Surg Hx  Soc Hx  Fam Hx           Chief Complaint   Patient presents with    Medicare AWV     Patient is here for a medicare wellness visit.      he is a 66 y.o. year old male who presents for follow-up of acute lacunar infarct affecting right side.  Patient states that he was admitted to the hospital August 21 for stroke.  Currently has physical therapy scheduled, and some feeling is coming back to his toes but overall he has got weakness all over.    Reviewed and agree with Nurse Note and duplicated in this note.  Reviewed PmHx, RxHx, FmHx, SocHx, AllgHx and updated and dated in the chart.    Family History   Problem Relation Age of Onset    Migraines Maternal Uncle     Hypertension Maternal Uncle     Cancer Maternal Uncle     Migraines Maternal Aunt     Asthma Sister     Hypertension Mother     Heart Disease Mother     Cancer Mother     Hypertension Maternal Grandmother     Migraines Maternal Grandmother     Cancer Maternal Grandmother        Past Medical History:  Diagnosis Date    Arthritis     Depression     Depression 04/25/2016     Depression 04/25/2016    Essential hypertension 04/25/2016    Essential hypertension 04/25/2016    Heavy smoker 04/25/2016    Heavy smoker 04/25/2016    Hep C w/ coma, chronic 04/25/2016    Hep C w/ coma, chronic 04/25/2016    Hypertension     Liver disease       Social History     Socioeconomic History    Marital status: Single     Spouse name: None    Number of children: None    Years of education: None    Highest education level: None   Tobacco Use    Smoking status: Some Days     Packs/day: .5     Types: Cigarettes    Smokeless tobacco: Never   Substance and Sexual Activity    Alcohol use: No    Drug use: Yes     Types: Marijuana Sherrie Mustache)    Sexual activity: Not Currently     Partners: Male     Birth control/protection: Condom     Social Determinants of Health     Financial Resource Strain: Medium Risk (07/16/2022)    Overall Financial Resource Strain (CARDIA)     Difficulty of Paying Living Expenses: Somewhat hard   Food Insecurity: No Food Insecurity (07/16/2022)    Hunger Vital Sign     Worried About Running Out of Food in the Last Year: Never true     Ran Out of Food in the Last Year: Never true   Transportation Needs: Unknown (07/16/2022)    PRAPARE - Transportation     Lack of Transportation (Non-Medical): No   Physical Activity: Inactive (07/16/2022)    Exercise Vital Sign     Days of Exercise per Week: 0 days     Minutes of Exercise per Session: 0 min   Housing Stability: Unknown (07/16/2022)    Housing Stability Vital Sign     Unstable Housing in the Last Year: No        Review of Systems - negative except as listed above      Objective:     Vitals:    07/16/22 1207   BP: 124/81   Pulse: 93   Resp: 16   Temp: 98.2 F (36.8 C)   SpO2: 96%   Weight: 135 lb (61.2 kg)   Height: 5\' 10"  (1.778 m)       Physical Examination: General appearance - alert, well appearing, and in no distress  Eyes - pupils equal and reactive, extraocular eye movements intact  Ears - bilateral TM's and external ear canals normal  Nose - normal  and patent, no erythema, discharge or polyps  Mouth - mucous membranes moist, pharynx normal without lesions  Neck - supple, no significant adenopathy  Neurological - alert, oriented, normal speech, no focal findings or movement disorder noted  Musculoskeletal - no joint tenderness, deformity or swelling  Extremities - peripheral pulses normal, no pedal edema, no clubbing or cyanosis  Skin - normal coloration and turgor, no rashes, no suspicious skin lesions noted     Assessment/ Plan:   1. Medicare annual wellness visit, subsequent  2. Acute lacunar infarction [facility])  Patient will continue with physical therapy at home  Follow-up with neurology  3. Chronic hepatitis C without hepatic coma ([place])  4. Essential hypertension  -     Comprehensive Metabolic Panel; Future  5. Hyperglycemia, unspecified  -     Hemoglobin A1C; Future  -     Comprehensive Metabolic Panel; Future  6. Personal history of tobacco use  -     PR VISIT TO DISCUSS LUNG CA SCREEN W LDCT  -     CT Lung Screen (Initial/Annual/Baseline); Future  7. Screening for cardiovascular condition  -     Lipid Panel; Future  8. Screening for prostate cancer  -     PSA Screening; Future  9. Weakness  -     CBC with Auto Differential; Future     Return in 3 months (on 10/15/2022).         I have discussed the diagnosis with the patient and the intended plan as seen in the above orders.  The patient has received an after-visit summary and questions were answered concerning future plans.     Medication Side Effects and Warnings were discussed with patient,  Patient Labs were reviewed and or requested, and  Patient Past Records were reviewed and or requested  yes       Pt agrees to call or return to clinic and/or go to closest ER with any worsening of symptoms.  This may include, but not limited to increased fever (>100.4) with NSAIDS or Tylenol, increased edema, confusion, rash, worsening of presenting symptoms.    Please note that this dictation was completed with  Dragon, the computer voice recognition software.  Quite often unanticipated grammatical, syntax, homophones, and other interpretive errors are inadvertently transcribed by the computer software.  Please disregard these errors.  Please excuse any errors that have escaped final proofreading.  Thank you.

## 2022-07-16 NOTE — Assessment & Plan Note (Signed)
Monitored by specialist- no acute findings meriting change in the plan

## 2022-07-16 NOTE — Patient Instructions (Signed)
Substance Use Disorder: Care Instructions  Overview     You can improve your life and health by stopping your use of alcohol or drugs. When you don't drink or use drugs, you may feel and sleep better. You may get along better with your family, friends, and coworkers. There are medicines and programs that can help with substance use disorder.  How can you care for yourself at home?  If you have been given medicine to help keep you sober or reduce your cravings, be sure to take it exactly as prescribed.  Talk to your doctor about programs that can help you stop using drugs or drinking alcohol.  Do not keep alcohol or drugs in your home.  Plan ahead. Think about what you'll say if other people ask you to drink or use drugs. Try not to spend time with people who drink or use drugs.  Use the time and money spent on drinking or drugs to do something that's important to you.  Preventing a relapse  Have a plan to deal with relapse. Learn to recognize changes in your thinking that lead you to drink or use drugs. Get help before you start to drink or use drugs again.  Try to stay away from situations, friends, or places that may lead you to drink or use drugs.  If you feel the need to drink alcohol or use drugs again, seek help right away. Call a trusted friend or family member. Some people get support from organizations such as Narcotics Anonymous or [facility] or from treatment facilities.  If you relapse, get help as soon as you can. Some people make a plan with another person that outlines what they want that person to do for them if they relapse. The plan usually includes how to handle the relapse and who to notify in case of relapse.  Don't give up. Remember that a relapse does not mean that you have failed. Use the experience to learn the triggers that lead you to drink or use drugs. Then quit again. Recovery is a lifelong process. Many people have several relapses before they are able to quit for  good.  Follow-up care is a key part of your treatment and safety. Be sure to make and go to all appointments, and call your doctor if you are having problems. It's also a good idea to know your test results and keep a list of the medicines you take.  When should you call for help?   Call 911  anytime you think you may need emergency care. For example, call if you or someone else:   Has overdosed or has withdrawal signs. Be sure to tell the emergency workers that you are or someone else is using or trying to quit using drugs. Overdose or withdrawal signs may include:  Losing consciousness.  Seizure.  Seeing or hearing things that aren't there (hallucinations).    Is thinking or talking about suicide or harming others.   Where to get help 24 hours a day, 7 days a week   If you or someone you know talks about suicide, self-harm, a mental health crisis, a substance use crisis, or any other kind of emotional distress, get help right away. You can:   Call the Suicide and Crisis Lifeline at 51.    Call 1-800-273-TALK (319)679-5966).    Text HOME to 639-202-8640 to access the Crisis Text Line.   Consider saving these numbers in your phone.  Go to 988lifeline.org for more information  or to chat online.  Call your doctor now or seek immediate medical care if:   You are having withdrawal symptoms. These may include nausea or vomiting, sweating, shakiness, and anxiety.   Watch closely for changes in your health, and be sure to contact your doctor if:   You have a relapse.    You need more help or support to stop.   Where can you learn more?  Go to https://www.bennett.info/ and enter H573 to learn more about "Substance Use Disorder: Care Instructions."  Current as of: March 21, 2023Content Version: 13.8   2006-2023 Healthwise, Incorporated.   Care instructions adapted under license by [facility]. If you have questions about a medical condition or this instruction, always ask your healthcare  professional. [place] any warranty or liability for your use of this information.           Learning About Being Active as an Older Adult  Why is being active important as you get older?     Being active is one of the best things you can do for your health. And it's never too late to start. Being active--or getting active, if you aren't already--has definite benefits. It can:  Give you more energy,  Keep your mind sharp.  Improve balance to reduce your risk of falls.  Help you manage chronic illness with fewer medicines.  No matter how old you are, how fit you are, or what health problems you have, there is a form of activity that will work for you. And the more physical activity you can do, the better your overall health will be.  What kinds of activity can help you stay healthy?  Being more active will make your daily activities easier. Physical activity includes planned exercise and things you do in daily life. There are four types of activity:  Aerobic.  Doing aerobic activity makes your heart and lungs strong.  Includes walking, dancing, and gardening.  Aim for at least 2 hours spread throughout the week.  It improves your energy and can help you sleep better.  Muscle-strengthening.  This type of activity can help maintain muscle and strengthen bones.  Includes climbing stairs, using resistance bands, and lifting or carrying heavy loads.  Aim for at least twice a week.  It can help protect the knees and other joints.  Stretching.  Stretching gives you better range of motion in joints and muscles.  Includes upper arm stretches, calf stretches, and gentle yoga.  Aim for at least twice a week, preferably after your muscles are warmed up from other activities.  It can help you function better in daily life.  Balancing.  This helps you stay coordinated and have good posture.  Includes heel-to-toe walking, tai chi, and certain types of yoga.  Aim for at least 3 days a week.  It can reduce  your risk of falling.  Even if you have a hard time meeting the recommendations, it's better to be more active than less active. All activity done in each category counts toward your weekly total. You'd be surprised how daily things like carrying groceries, keeping up with grandchildren, and taking the stairs can add up.  What keeps you from being active?  If you've had a hard time being more active, you're not alone. Maybe you remember being able to do more. Or maybe you've never thought of yourself as being active. It's frustrating when you can't do the things you want. Being more active can help. What's  holding you back?  Getting started.  Have a goal, but break it into easy tasks. Small steps build into big accomplishments.  Staying motivated.  If you feel like skipping your activity, remember your goal. Maybe you want to move better and stay independent. Every activity gets you one step closer.  Not feeling your best.  Start with 5 minutes of an activity you enjoy. Prove to yourself you can do it. As you get comfortable, increase your time.  You may not be where you want to be. But you're in the process of getting there. Everyone starts somewhere.  How can you find safe ways to stay active?  Talk with your doctor about any physical challenges you're facing. Make a plan with your doctor if you have a health problem or aren't sure how to get started with activity.  If you're already active, ask your doctor if there is anything you should change to stay safe as your body and health change.  If you tend to feel dizzy after you take medicine, avoid activity at that time. Try being active before you take your medicine. This will reduce your risk of falls.  If you plan to be active at home, make sure to clear your space before you get started. Remove things like TV cords, coffee tables, and throw rugs. It's safest to have plenty of space to move freely.  The key to getting more active is to take it slow and steady.  Try to improve only a little bit at a time. Pick just one area to improve on at first. And if an activity hurts, stop and talk to your doctor.  Where can you learn more?  Go to https://www.bennett.info/ and enter P600 to learn more about "Learning About Being Active as an Older Adult."  Current as of: June 6, 2023Content Version: 13.8   2006-2023 Healthwise, Incorporated.   Care instructions adapted under license by [facility]. If you have questions about a medical condition or this instruction, always ask your healthcare professional. [place] any warranty or liability for your use of this information.           Learning About Dental Care for Older Adults  Dental care for older adults: Overview  Dental care for older people is much the same as for younger adults. But older adults do have concerns that younger adults do not. Older adults may have problems with gum disease and decay on the roots of their teeth. They may need missing teeth replaced or broken fillings fixed. Or they may have dentures that need to be cared for. Some older adults may have trouble holding a toothbrush.  You can help remind the person you are caring for to brush and floss their teeth or to clean their dentures. In some cases, you may need to do the brushing and other dental care tasks. People who have trouble using their hands or who have dementia may need this extra help.  How can you help with dental care?  Normal dental care  To keep the teeth and gums healthy:  Brush the teeth with fluoride toothpaste twice a day--in the morning and at night--and floss at least once a day. Plaque can quickly build up on the teeth of older adults.  Watch for the signs of gum disease. These signs include gums that bleed after brushing or after eating hard foods, such as apples.  See a dentist regularly. Many experts recommend checkups every 6 months.  Keep the  dentist up to date on any new  medications the person is taking.  Encourage a balanced diet that includes whole grains, vegetables, and fruits, and that is low in saturated fat and sodium.  Encourage the person you're caring for not to use tobacco products. They can affect dental and general health.  Many older adults have a fixed income and feel that they can't afford dental care. But most towns and cities have programs in which dentists help older adults by lowering fees. Contact your area's public health offices or social services for information about dental care in your area.  Using a toothbrush  Older adults with arthritis sometimes have trouble brushing their teeth because they can't easily hold the toothbrush. Their hands and fingers may be stiff, painful, or weak. If this is the case, you can:  Offer an IT trainer toothbrush.  Enlarge the handle of a non-electric toothbrush by wrapping a sponge, an elastic bandage, or adhesive tape around it.  Push the toothbrush handle through a ball made of rubber or soft foam.  Make the handle longer and thicker by taping Popsicle sticks or tongue depressors to it.  You may also be able to buy special toothbrushes, toothpaste dispensers, and floss holders.  Your doctor may recommend a soft-bristle toothbrush if the person you care for bleeds easily. Bleeding can happen because of a health problem or from certain medicines.  A toothpaste for sensitive teeth may help if the person you care for has sensitive teeth.  How do you brush and floss someone's teeth?  If the person you are caring for has a hard time cleaning their teeth on their own, you may need to brush and floss their teeth for them. It may be easiest to have the person sit and face away from you, and to sit or stand behind them. That way you can steady their head against your arm as you reach around to floss and brush their teeth. Choose a place that has good lighting and is comfortable for both of you.  Before you begin, gather your supplies.  You will need gloves, floss, a toothbrush, and a container to hold water if you are not near a sink. Wash and dry your hands well and put on gloves. Start by flossing:  Gently work a piece of floss between each of the teeth toward the gums. A plastic flossing tool may make this easier, and they are available at most drugstores.  Curve the floss around each tooth into a U-shape and gently slide it under the gum line.  Move the floss firmly up and down several times to scrape off the plaque.  After you've finished flossing, throw away the used floss and begin brushing:  Wet the brush and apply toothpaste.  Place the brush at a 45-degree angle where the teeth meet the gums. Press firmly, and move the brush in small circles over the surface of the teeth.  Be careful not to brush too hard. Vigorous brushing can make the gums pull away from the teeth and can scratch the tooth enamel.  Brush all surfaces of the teeth, on the tongue side and on the cheek side. Pay special attention to the front teeth and all surfaces of the back teeth.  Brush chewing surfaces with short back-and-forth strokes.  After you've finished, help the person rinse the remaining toothpaste from their mouth.  Where can you learn more?  Go to https://www.bennett.info/ and enter F944 to learn more about "[place]  for Older Adults."  Current as of: November 13, 2022Content Version: 13.8   2006-2023 Healthwise, Incorporated.   Care instructions adapted under license by [facility]. If you have questions about a medical condition or this instruction, always ask your healthcare professional. [place] any warranty or liability for your use of this information.           Learning About Vision Tests  What are vision tests?     The four most common vision tests are visual acuity tests, refraction, visual field tests, and color vision tests.  Visual acuity (sharpness) tests  These tests are  used:  To see if you need glasses or contact lenses.  To monitor an eye problem.  To check an eye injury.  Visual acuity tests are done as part of routine exams. You may also have this test when you get your driver's license or apply for some types of jobs.  Visual field tests  These tests are used:  To check for vision loss in any area of your range of vision.  To screen for certain eye diseases.  To look for nerve damage after a stroke, head injury, or other problem that could reduce blood flow to the brain.  Refraction and color tests  A refraction test is done to find the right prescription for glasses and contact lenses.  A color vision test is done to check for color blindness.  Color vision is often tested as part of a routine exam. You may also have this test when you apply for a job where recognizing different colors is important, such as truck driving, Research officer, trade union, or the [employer].  How are vision tests done?  Visual acuity test   You cover one eye at a time.  You read aloud from a wall chart across the room.  You read aloud from a small card that you hold in your hand.  Refraction   You look into a special device.  The device puts lenses of different strengths in front of each eye to see how strong your glasses or contact lenses need to be.  Visual field tests   Your doctor may have you look through special machines.  Or your doctor may simply have you stare straight ahead while they move a finger into and out of your field of vision.  Color vision test   You look at pieces of printed test patterns in various colors. You say what number or symbol you see.  Your doctor may have you trace the number or symbol using a pointer.  How do these tests feel?  There is very little chance of having a problem from this test. If dilating drops are used for a vision test, they may make the eyes sting and cause a medicine taste in the mouth.  Follow-up care is a key part of your treatment and safety. Be sure to make and  go to all appointments, and call your doctor if you are having problems. It's also a good idea to know your test results and keep a list of the medicines you take.  Where can you learn more?  Go to https://www.bennett.info/ and enter G551 to learn more about "Learning About Vision Tests."  Current as of: June 6, 2023Content Version: 13.8   2006-2023 Healthwise, Incorporated.   Care instructions adapted under license by [facility]. If you have questions about a medical condition or this instruction, always ask your healthcare professional. Healthwise, Incorporated disclaims any warranty or liability  for your use of this information.           Advance Directives: Care Instructions  Overview  An advance directive is a legal way to state your wishes at the end of your life. It tells your family and your doctor what to do if you can't say what you want.  There are two main types of advance directives. You can change them any time your wishes change.  Living will.  This form tells your family and your doctor your wishes about life support and other treatment. The form is also called a declaration.  Medical power of attorney.  This form lets you name a person to make treatment decisions for you when you can't speak for yourself. This person is called a health care agent (health care proxy, health care surrogate). The form is also called a durable power of attorney for health care.  If you do not have an advance directive, decisions about your medical care may be made by a family member, or by a doctor or a judge who doesn't know you.  It may help to think of an advance directive as a gift to the people who care for you. If you have one, they won't have to make tough decisions by themselves.  For more information, including forms for your state, see the [place] website (RebankingSpace.hu).  Follow-up care is a key part of your treatment and safety. Be sure to  make and go to all appointments, and call your doctor if you are having problems. It's also a good idea to know your test results and keep a list of the medicines you take.  What should you include in an advance directive?  Many states have a unique advance directive form. (It may ask you to address specific issues.) Or you might use a universal form that's approved by many states.  If your form doesn't tell you what to address, it may be hard to know what to include in your advance directive. Use the questions below to help you get started.  Who do you want to make decisions about your medical care if you are not able to?  What life-support measures do you want if you have a serious illness that gets worse over time or can't be cured?  What are you most afraid of that might happen? (Maybe you're afraid of having pain, losing your independence, or being kept alive by machines.)  Where would you prefer to die? (Your home? A hospital? A nursing home?)  Do you want to donate your organs when you die?  Do you want certain religious practices performed before you die?  When should you call for help?  Be sure to contact your doctor if you have any questions.  Where can you learn more?  Go to https://www.bennett.info/ and enter R264 to learn more about "Advance Directives: Care Instructions."  Current as of: March 26, 2023Content Version: 13.8   2006-2023 Healthwise, Incorporated.   Care instructions adapted under license by [facility]. If you have questions about a medical condition or this instruction, always ask your healthcare professional. [place] any warranty or liability for your use of this information.           Learning About Lung Cancer Screening  What is screening for lung cancer?     Lung cancer screening is a way to find some lung cancers early, before a person has any symptoms of the cancer.  Lung cancer screening may help  those who have the highest  risk for lung cancer--people age 45 and older who are or were heavy smokers. For most people, who aren't at increased risk, screening for lung cancer probably isn't helpful.  Screening won't prevent cancer. And it may not find all lung cancers. Lung cancer screening may lower the risk of dying from lung cancer in a small number of people.  How is it done?  Lung cancer screening is done with a low-dose CT (computed tomography) scan. A CT scan uses X-rays, or radiation, to make detailed pictures of your body. Experts recommend that screening be done in medical centers that focus on finding and treating lung cancer.  Who is screening recommended for?  Lung cancer screening is recommended for people age 22 and older who are or were heavy smokers. That means people with a smoking history of at least 20 pack years. A pack year is a way to measure how heavy a smoker you are or were.  To figure out your pack years, multiply how many packs a day on average (assuming 20 cigarettes per pack) you have smoked by how many years you have smoked. For example:  If you smoked 1 pack a day for 20 years, that's 1 times 20. So you have a smoking history of 20 pack years.  If you smoked 2 packs a day for 10 years, that's 2 times 10. So you have a smoking history of 20 pack years.  Experts agree that screening is for people who have a high risk of lung cancer. But experts don't agree on what high risk means. Some say people age 12 or older with at least a 20-pack-year smoking history are high risk. Others say it's people age 86 or older with a 30-pack-year history.  To see if you could benefit from screening, first find out if you are at high risk for lung cancer. Your doctor can help you decide your lung cancer risk.  What are the risks of screening?  CT screening for lung cancer isn't perfect. It can show an abnormal result when it turns out there wasn't any cancer. This is called a false-positive result. This means you may need more  tests to make sure you don't have cancer. These tests can be harmful and cause a lot of worry.  These tests may include more CT scans and invasive testing like a lung biopsy. In a biopsy, the doctor takes a sample of tissue from inside your lung so it can be looked at under a microscope. A biopsy is the only way to tell if you have lung cancer. If the biopsy finds cancer, you and your doctor will have to decide how or whether to treat it.  Some lung cancers found on CT scans are harmless and would not have caused a problem if they had not been found through screening. But because doctors can't tell which ones will turn out to be harmless, most will be treated. This means that you may get treatment--including surgery, radiation, or chemotherapy--that you don't need.  There is a risk of damage to cells or tissue from being exposed to radiation, including the small amounts used in CTs, X-rays, and other medical tests. Over time, exposure to radiation may cause cancer and other health problems. But in most cases, the risk of getting cancer from being exposed to small amounts of radiation is low. It's not a reason to avoid these tests for most people.  What are the benefits of screening?  Your scan may be normal (negative).  For some people who are at higher risk, screening lowers the chance of dying of lung cancer. How much and how long you smoked helps to determine your risk level. Screening can find some cancers early, when treatment may be more likely to work.  What happens after screening?  The results of your CT scan will be sent to your doctor. Someone from your care team will explain the results of your scan and answer any questions you may have. If you need any follow-up, he or she will help you understand what to do next.  After a lung cancer screening, you can go back to your usual activities right away.  A lung cancer screening test can't tell if you have lung cancer. If your results are positive, your doctor  can't tell whether an abnormal finding is a harmless nodule, cancer, or something else without doing more tests.  What can you do to help prevent lung cancer?  Some lung cancers can't be prevented. But if you smoke, quitting smoking is the best step you can take to prevent lung cancer. If you want to quit, your doctor can recommend medicines or other ways to help.  Follow-up care is a key part of your treatment and safety. Be sure to make and go to all appointments, and call your doctor if you are having problems. It's also a good idea to know your test results and keep a list of the medicines you take.  Where can you learn more?  Go to https://www.bennett.info/ and enter Q940 to learn more about "Learning About Lung Cancer Screening."  Current as of: February 28, 2023Content Version: 13.8   2006-2023 [employer].   Care instructions adapted under license by [facility]. If you have questions about a medical condition or this instruction, always ask your healthcare professional. [place] any warranty or liability for your use of this information.           A Healthy Heart: Care Instructions  Your Care Instructions     Coronary artery disease, also called heart disease, occurs when a substance called plaque builds up in the vessels that supply oxygen-rich blood to your heart muscle. This can narrow the blood vessels and reduce blood flow. A heart attack happens when blood flow is completely blocked. A high-fat diet, smoking, and other factors increase the risk of heart disease.  Your doctor has found that you have a chance of having heart disease. You can do lots of things to keep your heart healthy. It may not be easy, but you can change your diet, exercise more, and quit smoking. These steps really work to lower your chance of heart disease.  Follow-up care is a key part of your treatment and safety. Be sure to make and go to all appointments, and  call your doctor if you are having problems. It's also a good idea to know your test results and keep a list of the medicines you take.  How can you care for yourself at home?  Diet   Use less salt when you cook and eat. This helps lower your blood pressure. Taste food before salting. Add only a little salt when you think you need it. With time, your taste buds will adjust to less salt.    Eat fewer snack items, fast foods, canned soups, and other high-salt, high-fat, processed foods.    Read food labels and try to avoid saturated and trans fats. They  increase your risk of heart disease by raising cholesterol levels.    Limit the amount of solid fat-butter, margarine, and shortening-you eat. Use olive, peanut, or canola oil when you cook. Bake, broil, and steam foods instead of frying them.    Eat a variety of fruit and vegetables every day. Dark green, deep orange, red, or yellow fruits and vegetables are especially good for you. Examples include spinach, carrots, peaches, and berries.    Foods high in fiber can reduce your cholesterol and provide important vitamins and minerals. High-fiber foods include whole-grain cereals and breads, oatmeal, beans, brown rice, citrus fruits, and apples.    Eat lean proteins. Heart-healthy proteins include seafood, lean meats and poultry, eggs, beans, peas, nuts, seeds, and soy products.    Limit drinks and foods with added sugar. These include candy, desserts, and soda pop.   Lifestyle changes   If your doctor recommends it, get more exercise. Walking is a good choice. Bit by bit, increase the amount you walk every day. Try for at least 30 minutes on most days of the week. You also may want to swim, bike, or do other activities.    Do not smoke. If you need help quitting, talk to your doctor about stop-smoking programs and medicines. These can increase your chances of quitting for good. Quitting smoking may be the most important step you can take to protect your heart.  It is never too late to quit.    Limit alcohol to 2 drinks a day for men and 1 drink a day for women. Too much alcohol can cause health problems.    Manage other health problems such as diabetes, high blood pressure, and high cholesterol. If you think you may have a problem with alcohol or drug use, talk to your doctor.   Medicines   Take your medicines exactly as prescribed. Call your doctor if you think you are having a problem with your medicine.    If your doctor recommends aspirin, take the amount directed each day. Make sure you take aspirin and not another kind of pain reliever, such as acetaminophen (Tylenol).   When should you call for help?   Call 911 if you have symptoms of a heart attack. These may include:   Chest pain or pressure, or a strange feeling in the chest.    Sweating.    Shortness of breath.    Pain, pressure, or a strange feeling in the back, neck, jaw, or upper belly or in one or both shoulders or arms.    Lightheadedness or sudden weakness.    A fast or irregular heartbeat.   After you call 911, the operator may tell you to chew 1 adult-strength or 2 to 4 low-dose aspirin. Wait for an ambulance. Do not try to drive yourself.  Watch closely for changes in your health, and be sure to contact your doctor if you have any problems.  Where can you learn more?  Go to https://www.bennett.info/ and enter F075 to learn more about "A Healthy Heart: Care Instructions."  Current as of: June 25, 2023Content Version: 13.8   2006-2023 [employer].   Care instructions adapted under license by [facility]. If you have questions about a medical condition or this instruction, always ask your healthcare professional. [place] any warranty or liability for your use of this information.      Personalized Preventive Plan for Shawn Jackson - 07/16/2022  Medicare offers a range of preventive health benefits. Some of  the tests and screenings are  paid in full while other may be subject to a deductible, co-insurance, and/or copay.    Some of these benefits include a comprehensive review of your medical history including lifestyle, illnesses that may run in your family, and various assessments and screenings as appropriate.    After reviewing your medical record and screening and assessments performed today your provider may have ordered immunizations, labs, imaging, and/or referrals for you.  A list of these orders (if applicable) as well as your Preventive Care list are included within your After Visit Summary for your review.    Other Preventive Recommendations:    A preventive eye exam performed by an eye specialist is recommended every 1-2 years to screen for glaucoma; cataracts, macular degeneration, and other eye disorders.  A preventive dental visit is recommended every 6 months.  Try to get at least 150 minutes of exercise per week or 10,000 steps per day on a pedometer .  Order or download the FREE "Exercise & Physical Activity: Your Everyday Guide" from [employer]. Call 916-311-5121 or search [employer] online.  You need 1200-1500 mg of calcium and 1000-2000 IU of vitamin D per day. It is possible to meet your calcium requirement with diet alone, but a vitamin D supplement is usually necessary to meet this goal.  When exposed to the sun, use a sunscreen that protects against both UVA and UVB radiation with an SPF of 30 or greater. Reapply every 2 to 3 hours or after sweating, drying off with a towel, or swimming.  Always wear a seat belt when traveling in a car. Always wear a helmet when riding a bicycle or motorcycle.

## 2022-07-17 LAB — PSA SCREENING: PSA: 0.4 ng/mL (ref 0.01–4.0)

## 2022-07-17 LAB — COMPREHENSIVE METABOLIC PANEL
ALT: 22 U/L (ref 12–78)
AST: 18 U/L (ref 15–37)
Albumin/Globulin Ratio: 0.8 — ABNORMAL LOW (ref 1.1–2.2)
Albumin: 3.6 g/dL (ref 3.5–5.0)
Alk Phosphatase: 114 U/L (ref 45–117)
Anion Gap: 3 mmol/L — ABNORMAL LOW (ref 5–15)
BUN: 12 MG/DL (ref 6–20)
Bun/Cre Ratio: 11 — ABNORMAL LOW (ref 12–20)
CO2: 31 mmol/L (ref 21–32)
Calcium: 9.5 MG/DL (ref 8.5–10.1)
Chloride: 103 mmol/L (ref 97–108)
Creatinine: 1.14 MG/DL (ref 0.70–1.30)
Est, Glom Filt Rate: >60 mL/min/{1.73_m2} (ref >60)
Globulin: 4.3 g/dL — ABNORMAL HIGH (ref 2.0–4.0)
Glucose: 88 mg/dL (ref 65–100)
Potassium: 4.2 mmol/L (ref 3.5–5.1)
Sodium: 137 mmol/L (ref 136–145)
Total Bilirubin: 0.5 MG/DL (ref 0.2–1.0)
Total Protein: 7.9 g/dL (ref 6.4–8.2)

## 2022-07-17 LAB — LIPID PANEL
Chol/HDL Ratio: 3.2 (ref 0.0–5.0)
Cholesterol, Total: 156 MG/DL (ref <200)
HDL: 49 MG/DL
LDL Calculated: 83.6 MG/DL (ref 0–100)
Triglycerides: 117 MG/DL (ref <150)
VLDL Cholesterol Calculated: 23.4 MG/DL

## 2022-07-17 LAB — HEMOGLOBIN A1C
Hemoglobin A1C: 5.7 % — ABNORMAL HIGH (ref 4.0–5.6)
eAG: 117 mg/dL

## 2022-08-05 MED ORDER — NICOTINE 14 MG/24HR TD PT24
14 MG/24HR | MEDICATED_PATCH | TRANSDERMAL | 3 refills | Status: AC
Start: 2022-08-05 — End: 2023-08-05

## 2022-08-05 MED ORDER — AMLODIPINE BESYLATE 5 MG PO TABS
5 MG | ORAL_TABLET | Freq: Every day | ORAL | 1 refills | Status: AC
Start: 2022-08-05 — End: 2022-10-02

## 2022-08-05 MED ORDER — TRAZODONE HCL 50 MG PO TABS
50 MG | ORAL_TABLET | Freq: Every evening | ORAL | 1 refills | Status: AC
Start: 2022-08-05 — End: 2022-10-02

## 2022-08-05 MED ORDER — FAMOTIDINE 40 MG PO TABS
40 MG | ORAL_TABLET | Freq: Every day | ORAL | 1 refills | Status: AC
Start: 2022-08-05 — End: 2022-10-04

## 2022-08-05 MED ORDER — CLOPIDOGREL BISULFATE 75 MG PO TABS
75 MG | ORAL_TABLET | Freq: Every day | ORAL | 3 refills | Status: DC
Start: 2022-08-05 — End: 2022-12-05

## 2022-08-05 NOTE — Telephone Encounter (Signed)
Patient didn't  give this list of meds on his visit

## 2022-08-08 NOTE — Home Health (Signed)
Formatting of this note might be different from the original.  WRITER CALLED PATIENT TO SCHEDULE A SNV ON THIS DAY BUT PATIENT STATED HE STILL IS DEALING WITH BED BUGS AND WOULD LIKE TO BE DISCHARGED FROM SN CARE EARLY. WRITER VOICED UNDERSTADING. HE STATED HE HAS BEEN DOING GOOD AND STATES HE WILL BE OK TO BE DISCHARGED TO SELF. WRITER PROVIDED PATIENT WITH DISCHARGE INSTRUCTIONS. UNDERSTANDING VOICED. PATIENT HAS DISPLAYED KNOWLEDGE OF HIS MEDICATIONS AND IS ABLE TO TEACH BACK S/S OF STROKE ON PREVIOUS SNV. PATIENT WILL BE DISCHARGED FROM SN WITHOUT A VISIT, PER PATIENT REQUEST.  Electronically signed by Karie Kirks, RN at 08/08/2022  3:19 PM EDT

## 2022-10-02 MED ORDER — FAMOTIDINE 40 MG PO TABS
40 MG | ORAL_TABLET | Freq: Every day | ORAL | 1 refills | Status: DC
Start: 2022-10-02 — End: 2023-01-02

## 2022-10-02 MED ORDER — AMLODIPINE BESYLATE 5 MG PO TABS
5 MG | ORAL_TABLET | Freq: Every day | ORAL | 1 refills | Status: DC
Start: 2022-10-02 — End: 2022-12-05

## 2022-10-02 MED ORDER — TRAZODONE HCL 50 MG PO TABS
50 MG | ORAL_TABLET | Freq: Every evening | ORAL | 1 refills | Status: DC
Start: 2022-10-02 — End: 2023-01-02

## 2022-10-09 ENCOUNTER — Ambulatory Visit: Payer: MEDICARE | Primary: Family Medicine

## 2022-10-16 ENCOUNTER — Ambulatory Visit: Payer: MEDICARE | Attending: Family Medicine | Primary: Family Medicine

## 2022-10-30 ENCOUNTER — Inpatient Hospital Stay: Admit: 2022-10-30 | Payer: MEDICARE | Attending: Family Medicine | Primary: Family Medicine

## 2022-10-30 DIAGNOSIS — Z87891 Personal history of nicotine dependence: Secondary | ICD-10-CM

## 2022-12-05 MED ORDER — CLOPIDOGREL BISULFATE 75 MG PO TABS
75 MG | ORAL_TABLET | Freq: Every day | ORAL | 3 refills | Status: AC
Start: 2022-12-05 — End: 2023-01-13

## 2022-12-05 MED ORDER — AMLODIPINE BESYLATE 5 MG PO TABS
5 MG | ORAL_TABLET | Freq: Every day | ORAL | 1 refills | Status: AC
Start: 2022-12-05 — End: 2023-01-13

## 2023-01-02 MED ORDER — FAMOTIDINE 40 MG PO TABS
40 MG | ORAL_TABLET | Freq: Every day | ORAL | 1 refills | Status: AC
Start: 2023-01-02 — End: 2023-06-03

## 2023-01-02 MED ORDER — TRAZODONE HCL 50 MG PO TABS
50 MG | ORAL_TABLET | ORAL | 1 refills | Status: AC
Start: 2023-01-02 — End: 2023-06-03

## 2023-01-13 MED ORDER — AMLODIPINE BESYLATE 5 MG PO TABS
5 MG | ORAL_TABLET | Freq: Every day | ORAL | 3 refills | Status: AC
Start: 2023-01-13 — End: 2023-06-02

## 2023-01-13 MED ORDER — CLOPIDOGREL BISULFATE 75 MG PO TABS
75 MG | ORAL_TABLET | Freq: Every day | ORAL | 3 refills | Status: AC
Start: 2023-01-13 — End: 2023-08-04

## 2023-06-02 MED ORDER — AMLODIPINE BESYLATE 5 MG PO TABS
5 | ORAL_TABLET | Freq: Every day | ORAL | 3 refills | Status: AC
Start: 2023-06-02 — End: ?

## 2023-06-03 MED ORDER — TRAZODONE HCL 50 MG PO TABS
50 | ORAL_TABLET | ORAL | 1 refills | Status: AC
Start: 2023-06-03 — End: ?

## 2023-06-03 MED ORDER — FAMOTIDINE 40 MG PO TABS
40 | ORAL_TABLET | Freq: Every day | ORAL | 1 refills | Status: AC
Start: 2023-06-03 — End: ?

## 2023-08-04 MED ORDER — CLOPIDOGREL BISULFATE 75 MG PO TABS
75 MG | ORAL_TABLET | Freq: Every day | ORAL | 3 refills | Status: DC
Start: 2023-08-04 — End: 2023-08-25

## 2023-08-25 MED ORDER — CLOPIDOGREL BISULFATE 75 MG PO TABS
75 | ORAL_TABLET | Freq: Every day | ORAL | 1 refills | Status: AC
Start: 2023-08-25 — End: ?

## 2023-08-25 NOTE — Telephone Encounter (Signed)
Patient is overdue for yearly follow-up

## 2023-08-27 NOTE — Telephone Encounter (Signed)
Patient is overdue for yearly follow-up

## 2023-09-11 NOTE — Telephone Encounter (Signed)
 Patient is overdue for yearly visit

## 2023-09-12 NOTE — Telephone Encounter (Signed)
 Patient is overdue for yearly physical

## 2023-09-18 NOTE — Telephone Encounter (Signed)
 Patient is overdue for yearly physical

## 2023-10-14 NOTE — Telephone Encounter (Signed)
 Patient is overdue for yearly physical

## 2023-10-24 NOTE — Telephone Encounter (Signed)
 Patient is overdue for yearly appt

## 2023-10-28 MED ORDER — AMLODIPINE BESYLATE 5 MG PO TABS
5 | ORAL_TABLET | Freq: Every day | ORAL | 0 refills | 90.00000 days | Status: AC
Start: 2023-10-28 — End: ?

## 2023-10-28 NOTE — Telephone Encounter (Signed)
 Refill Medication:  Amlodipine  5 mg patient has an appt. In Feb   Medication to be sent to [facility] [phone]

## 2023-12-01 ENCOUNTER — Ambulatory Visit: Payer: MEDICARE | Attending: Family Medicine | Primary: Family Medicine

## 2024-02-09 NOTE — Telephone Encounter (Signed)
 Patient has not been seen since 2023, will need physical
